# Patient Record
Sex: Male | Born: 1985
Health system: Southern US, Community
[De-identification: ages and names within clinical notes are randomized; demographics above are authoritative.]

## PROBLEM LIST (undated history)

## (undated) ENCOUNTER — Emergency Department (HOSPITAL_COMMUNITY): Source: Home / Self Care

## (undated) DIAGNOSIS — T7840XA Allergy, unspecified, initial encounter: Secondary | ICD-10-CM

## (undated) DIAGNOSIS — I10 Essential (primary) hypertension: Secondary | ICD-10-CM

## (undated) HISTORY — DX: Allergy, unspecified, initial encounter: T78.40XA

## (undated) HISTORY — PX: HIP PINNING: SHX1757

## (undated) HISTORY — PX: APPENDECTOMY: SHX54

---
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
## 2007-07-20 ENCOUNTER — Emergency Department (HOSPITAL_COMMUNITY): Admission: EM | Admit: 2007-07-20 | Discharge: 2007-07-21 | Payer: Self-pay | Admitting: Emergency Medicine

## 2008-06-07 ENCOUNTER — Ambulatory Visit (HOSPITAL_COMMUNITY): Admission: EM | Admit: 2008-06-07 | Discharge: 2008-06-08 | Payer: Self-pay | Admitting: Emergency Medicine

## 2008-06-08 ENCOUNTER — Encounter (INDEPENDENT_AMBULATORY_CARE_PROVIDER_SITE_OTHER): Payer: Self-pay | Admitting: General Surgery

## 2008-10-13 ENCOUNTER — Encounter: Admission: RE | Admit: 2008-10-13 | Discharge: 2008-10-13 | Payer: Self-pay | Admitting: General Surgery

## 2010-08-21 LAB — DIFFERENTIAL
Basophils Absolute: 0 10*3/uL (ref 0.0–0.1)
Eosinophils Relative: 1 % (ref 0–5)
Lymphocytes Relative: 12 % (ref 12–46)
Lymphs Abs: 1.3 10*3/uL (ref 0.7–4.0)
Monocytes Absolute: 0.8 10*3/uL (ref 0.1–1.0)
Neutro Abs: 8.8 10*3/uL — ABNORMAL HIGH (ref 1.7–7.7)

## 2010-08-21 LAB — CBC
HCT: 43.1 % (ref 39.0–52.0)
MCHC: 33.1 g/dL (ref 30.0–36.0)
MCV: 87.8 fL (ref 78.0–100.0)
Platelets: 254 10*3/uL (ref 150–400)
RDW: 13.7 % (ref 11.5–15.5)
WBC: 11 10*3/uL — ABNORMAL HIGH (ref 4.0–10.5)

## 2010-08-21 LAB — COMPREHENSIVE METABOLIC PANEL
AST: 25 U/L (ref 0–37)
Albumin: 4.3 g/dL (ref 3.5–5.2)
BUN: 13 mg/dL (ref 6–23)
Calcium: 9.1 mg/dL (ref 8.4–10.5)
Creatinine, Ser: 0.83 mg/dL (ref 0.4–1.5)
GFR calc Af Amer: >60 mL/min (ref >60)
Total Bilirubin: 0.7 mg/dL (ref 0.3–1.2)

## 2010-08-21 LAB — URINALYSIS, ROUTINE W REFLEX MICROSCOPIC
Bilirubin Urine: NEGATIVE
Glucose, UA: NEGATIVE mg/dL
Hgb urine dipstick: NEGATIVE
Ketones, ur: NEGATIVE mg/dL
Protein, ur: NEGATIVE mg/dL
Urobilinogen, UA: 1 mg/dL (ref 0.0–1.0)

## 2010-08-21 LAB — LIPASE, BLOOD: Lipase: 16 U/L (ref 11–59)

## 2010-09-03 ENCOUNTER — Emergency Department (HOSPITAL_COMMUNITY)
Admission: EM | Admit: 2010-09-03 | Discharge: 2010-09-03 | Disposition: A | Discharge status: Home / Self Care | Attending: Emergency Medicine | Admitting: Emergency Medicine

## 2010-09-03 DIAGNOSIS — K612 Anorectal abscess: Secondary | ICD-10-CM | POA: Insufficient documentation

## 2010-09-04 ENCOUNTER — Emergency Department (HOSPITAL_COMMUNITY)
Admission: EM | Admit: 2010-09-04 | Discharge: 2010-09-04 | Disposition: A | Discharge status: Home / Self Care | Attending: Emergency Medicine | Admitting: Emergency Medicine

## 2010-09-04 DIAGNOSIS — IMO0002 Reserved for concepts with insufficient information to code with codable children: Secondary | ICD-10-CM | POA: Insufficient documentation

## 2010-09-04 DIAGNOSIS — Y838 Other surgical procedures as the cause of abnormal reaction of the patient, or of later complication, without mention of misadventure at the time of the procedure: Secondary | ICD-10-CM | POA: Insufficient documentation

## 2010-09-04 DIAGNOSIS — F17200 Nicotine dependence, unspecified, uncomplicated: Secondary | ICD-10-CM | POA: Insufficient documentation

## 2010-09-04 LAB — BASIC METABOLIC PANEL
BUN: 12 mg/dL (ref 6–23)
CO2: 25 mEq/L (ref 19–32)
Glucose, Bld: 99 mg/dL (ref 70–99)
Potassium: 3.5 mEq/L (ref 3.5–5.1)
Sodium: 137 mEq/L (ref 135–145)

## 2010-09-04 LAB — DIFFERENTIAL
Basophils Absolute: 0 10*3/uL (ref 0.0–0.1)
Eosinophils Relative: 3 % (ref 0–5)
Lymphocytes Relative: 27 % (ref 12–46)
Lymphs Abs: 2.4 10*3/uL (ref 0.7–4.0)
Monocytes Absolute: 0.9 10*3/uL (ref 0.1–1.0)
Neutro Abs: 5.2 10*3/uL (ref 1.7–7.7)

## 2010-09-04 LAB — CBC
HCT: 38.9 % — ABNORMAL LOW (ref 39.0–52.0)
Hemoglobin: 12.7 g/dL — ABNORMAL LOW (ref 13.0–17.0)
MCV: 86.6 fL (ref 78.0–100.0)
WBC: 8.7 10*3/uL (ref 4.0–10.5)

## 2010-09-04 LAB — APTT: aPTT: 35 seconds (ref 24–37)

## 2010-09-18 NOTE — H&P (Signed)
NAMEHUBBARD, SELDON               ACCOUNT NO.:  [id number]   MEDICAL RECORD NO.:  [id number]          PATIENT TYPE:  INP   LOCATION:  0105                         FACILITY:  [facility]   PHYSICIAN:  Sharlet Salina T. Morrison, M.D.DATE OF BIRTH:  1986-01-19   DATE OF ADMISSION:  06/07/2008  DATE OF DISCHARGE:                              HISTORY & PHYSICAL   CHIEF COMPLAINT:  Abdominal pain.   HISTORY OF PRESENT ILLNESS:  Alexander Morrison is a 25 year old African  American male who states for about a week he has been having some  intermittent mild right lower quadrant pain.  He now, however, about 12  hours ago developed more constant severe right lower quadrant pain.  This is worse with motion.  No radiation.  He does not have any nausea,  vomiting, anorexia, fever or chills.  Bowel movements have been normal.  He has no history of any similar chronic GI complaints.   PAST MEDICAL HISTORY:  He has had bilateral hip pinning for a slipped  epiphysis at age 79.  Medically he has some seasonal allergies.   MEDICATIONS:  Flonase.   ALLERGIES:  IODINE.   SOCIAL HISTORY:  He is a Consulting civil engineer.  Smokes some cigarettes, drinks  occasional alcohol.   FAMILY HISTORY:  Positive for diabetes, gallstones in the grandparents.   REVIEW OF SYSTEMS:  GENERAL:  No fever, chills, malaise.  HEENT:  No  vision, hearing, swallowing problems.  RESPIRATORY:  No shortness of  breath, cough, wheezing.  CARDIAC:  Denies palpitations, chest pain.  ABDOMEN:  GI as above.  GU: No urinary burning, frequency.   PHYSICAL EXAMINATION:  Temperature is 98.2, pulse 88, respirations 16,  blood pressure 131/74.  GENERAL:  He is a somewhat obese African American male in no acute  distress.  SKIN:  Warm and dry.  No rash or infection.  HEENT:  No palpable mass or thyromegaly.  Sclerae nonicteric.  Oropharynx clear.  LYMPH NODES:  No cervical, supraclavicular or inguinal nodes palpable.  LUNGS:  Clear without wheezing or increased  work of breathing.  CARDIAC:  Regular rate and rhythm.  No murmurs.  No edema.  No JVD.  ABDOMEN:  Obese, decreased bowel sounds.  There is localized tenderness  in the right lower quadrant.  Some guarding.  No discernible masses or  organomegaly.  GU:  Normal male.  EXTREMITIES:  No joint swelling deformity.  NEUROLOGIC:  Alert, fully oriented.  Motor and sensory exams are grossly  normal.   LABORATORIES:  White count mildly elevated at 11,000.  Remainder of CBC,  electrolytes, LFTs, urinalysis unremarkable.   CT scan of the abdomen and pelvis was obtained from the emergency room.  This shows evidence of acute appendicitis with inflammatory change  around a dilated appendix.  Also noticed probable cholelithiasis.   ASSESSMENT/PLAN:  1. Acute appendicitis.  2. Cholelithiasis, probably asymptomatic.   The patient was receiving IV fluids, broad-spectrum antibiotics.  Will  be taken to the operating room for emergency laparoscopic appendectomy.      Alexander Morrison, M.D.  Electronically Signed     BTH/MEDQ  D:  06/08/2008  T:  06/08/2008  Job:  [id number]

## 2010-09-18 NOTE — Op Note (Signed)
NAMEVINCEN, BEJAR               ACCOUNT NO.:  [id number]   MEDICAL RECORD NO.:  [id number]          PATIENT TYPE:  INP   LOCATION:  0105                         FACILITY:  [facility]   PHYSICIAN:  Sharlet Salina T. Hoxworth, M.D.DATE OF BIRTH:  [birth year]   DATE OF PROCEDURE:  06/08/2008  DATE OF DISCHARGE:                               OPERATIVE REPORT   PREOPERATIVE DIAGNOSIS:  Acute appendicitis.   POSTOPERATIVE DIAGNOSIS:  Acute appendicitis.   PROCEDURE:  Laparoscopic appendectomy.   SURGEON:  Lorne Skeens. Hoxworth, M.D.   ANESTHESIA:  General.   BRIEF HISTORY:  Alexander Morrison is a 25 year old male who presents with  several days of mild right lower quadrant pain but now 12 hours of  worsening pain localized in the right lower quadrant without any  associated symptoms.  He has localized tenderness with guarding and CT  scan has shown evidence of acute appendicitis.  I have recommended  proceeding with laparoscopic and possible open appendectomy.  The nature  of the procedure, its indications, risks of bleeding, infection,  anesthetic risks were discussed and understood.  He is now brought to  the operating room for this procedure.   DESCRIPTION OF OPERATION:  The patient was brought to operating room,  placed in supine position on the operating table and general  endotracheal anesthesia was induced.  Foley catheter was placed.  The  abdomen was widely sterilely prepped and draped.  He received  preoperative IV antibiotics.  PAS were in place.  Correct patient and  procedure were verified.  Local anesthesia was used to infiltrate the  trocar sites.  Abdominal access was obtained in the right upper quadrant  with a 5 mm OptiVu trocar without difficulty and pneumoperitoneum  established.  Laparoscopy showed no evidence of trocar injury.  Under  direct vision an additional 5 mm trocar was placed in the midline just  above the umbilicus and a 12 mm trocar in the left lower quadrant.   The  appendix was exposed in the right lower quadrant and was acutely  inflamed with exudate but no gangrene or perforation.  It was somewhat  adherent to the terminal ileum and mesentery of the terminal ileum and  it was separated using careful blunt dissection.  The mesentery had some  chronic attachments to the mesentery of the terminal ileum which were  carefully divided with Harmonic scalpel completely freeing the mesentery  and appendix was further mobilized dividing lateral peritoneal  attachments.  The base the appendix was relatively uninflamed.  The  mesoappendix was then sequentially divided with Harmonic scalpel until  the appendix was completely freed down to its base and tip of the cecum.  The appendix was divided at the tip of the cecum with a single firing of  the Endo-GIA 45 mm blue load stapler.  The staple line was intact and  without bleeding.  The appendix was placed in the EndoCatch bag.  The  right lower quadrant was thoroughly irrigated and complete hemostasis  assured.  The appendix was then brought out  through the left lower quadrant trocar site along with the trocar.  CO2  was evacuated and 5 mm trocars removed.  The skin incisions were closed  with Dermabond.  Sponge, needle and instrument counts were correct.  The  patient was taken to recovery in good condition.      Lorne Skeens. Hoxworth, M.D.  Electronically Signed     BTH/MEDQ  D:  06/08/2008  T:  06/08/2008  Job:  [id number]

## 2012-09-02 ENCOUNTER — Ambulatory Visit: Payer: Self-pay | Admitting: Physician Assistant

## 2012-09-02 VITALS — BP 138/86 | HR 118 | Temp 98.3°F | Resp 18 | Ht 71.0 in | Wt 293.0 lb

## 2012-09-02 DIAGNOSIS — R062 Wheezing: Secondary | ICD-10-CM

## 2012-09-02 DIAGNOSIS — J309 Allergic rhinitis, unspecified: Secondary | ICD-10-CM

## 2012-09-02 DIAGNOSIS — R05 Cough: Secondary | ICD-10-CM

## 2012-09-02 DIAGNOSIS — R059 Cough, unspecified: Secondary | ICD-10-CM

## 2012-09-02 MED ORDER — ALBUTEROL SULFATE (2.5 MG/3ML) 0.083% IN NEBU
2.5000 mg | INHALATION_SOLUTION | Freq: Once | RESPIRATORY_TRACT | Status: AC
  Administered 2012-09-02: 2.5 mg via RESPIRATORY_TRACT

## 2012-09-02 MED ORDER — GUAIFENESIN ER 1200 MG PO TB12: 1.0000 | ORAL_TABLET | Freq: Two times a day (BID) | ORAL | Status: DC | PRN

## 2012-09-02 MED ORDER — IPRATROPIUM BROMIDE 0.02 % IN SOLN
0.5000 mg | Freq: Once | RESPIRATORY_TRACT | Status: AC
  Administered 2012-09-02: 0.5 mg via RESPIRATORY_TRACT

## 2012-09-02 MED ORDER — BENZONATATE 100 MG PO CAPS: 100.0000 mg | ORAL_CAPSULE | Freq: Three times a day (TID) | ORAL | Status: DC | PRN

## 2012-09-02 MED ORDER — ALBUTEROL SULFATE HFA 108 (90 BASE) MCG/ACT IN AERS: 2.0000 | INHALATION_SPRAY | RESPIRATORY_TRACT | Status: DC | PRN

## 2012-09-02 MED ORDER — FLUTICASONE PROPIONATE 50 MCG/ACT NA SUSP: 2.0000 | Freq: Every day | NASAL | Status: DC

## 2012-09-02 MED ORDER — IPRATROPIUM BROMIDE 0.03 % NA SOLN: 2.0000 | Freq: Two times a day (BID) | NASAL | Status: DC

## 2012-09-02 NOTE — Patient Instructions (Addendum)
Once your symptoms are under control, please stop the Atrovent nasal spray (you may resume it as needed), but continue the Flonase nasal spray (at least for the duration of your allergy season). Continue Allegra, Claritin or Zyrtec each day, as needed. Drink at least 64 ounces of water each day. Change your clothes and take a shower after you've been outside, to remove the pollen from your hair and skin, and to reduce continued exposure to allergens.  Did you know that you begin to benefit from quitting smoking within the first twenty minutes? It's TRUE.  At 20 minutes: -blood pressure decreases -pulse rate drops -body temperature of hands and feet increases  At 8 hours: -carbon monoxide level in blood drops to normal -oxygen level in blood increases to normal  At 24 hours: -the chance of heart attack decreases  At 48 hours: -nerve endings start regrowing -ability to smell and taste is enhanced  2 weeks-3 months: -circulation improves -walking becomes easier -lung function improves  1-9 months: -coughing, sinus congestion, fatigue and shortness of breath decreases  1 year: -excess risk of heart disease is decreased to HALF that of a smoker  5 years: Stroke risk is reduced to that of people who have never smoked  10 years: -risk of lung cancer drops to as little as half that of continuing smokers -risk of cancer of the mouth, throat, esophagus, bladder, kidney and pancreas decreases -risk of ulcer decreases  15 years -risk of heart disease is now similar to that of people who have never smoked -risk of death returns to nearly the level of people who have never smoked

## 2012-09-02 NOTE — Progress Notes (Signed)
  Subjective:    Patient ID: Alexander Morrison, male    DOB: March 27, 1986, 27 y.o.   MRN: [id number]  HPI This 27 y.o. male presents for evaluation of cough. "I've been smoking since I was 19.  Since I was 22, every time I get a cold, or allergies, I get bronchitis.  I quit smoking yesterday, and started coughing."   He has nasal congestion, post-nasal drainage, ear pressure.  Cough is non-productive.  Claritin and Mucinex are not controlling his symptoms.  Past medical history, surgical history, family history, social history and problem list reviewed.  Review of Systems As above.    Objective:   Physical Exam Blood pressure 138/86, pulse 118, temperature 98.3 F (36.8 C), temperature source Oral, resp. rate 18, height 5\' 11"  (1.803 m), weight 293 lb (132.904 kg), SpO2 95.00%. Body mass index is 40.88 kg/(m^2). Well-developed, well nourished BM who is awake, alert and oriented, in NAD. HEENT: /AT, PERRL, EOMI.  Sclera and conjunctiva are clear.  EAC are patent, TMs are normal in appearance. Nasal mucosa is congested,pink and moist. OP is clear. Neck: supple, non-tender, no lymphadenopathy, thyromegaly. Heart: RRR, no murmur Lungs: normal effort, high-pitched musical wheezes on exhalation. Extremities: no cyanosis, clubbing or edema. Skin: warm and dry without rash. Psychologic: good mood and appropriate affect, normal speech and behavior.  Albuterol + Atrovent neb treatment with reduction in wheezing and symptom improvement.      Assessment & Plan:  Cough - Plan: Guaifenesin (MUCINEX MAXIMUM STRENGTH) 1200 MG TB12, benzonatate (TESSALON) 100 MG capsule  Wheezing - Plan: albuterol (PROVENTIL) (2.5 MG/3ML) 0.083% nebulizer solution 2.5 mg, ipratropium (ATROVENT) nebulizer solution 0.5 mg, albuterol (PROVENTIL HFA;VENTOLIN HFA) 108 (90 BASE) MCG/ACT inhaler  Allergic rhinitis - Plan: ipratropium (ATROVENT) 0.03 % nasal spray, fluticasone (FLONASE) 50 MCG/ACT nasal spray  Supportive  care.  Anticipatory guidance.  RTC if symptoms worsen/persist.  Fernande Bras, PA-C Certified Physician Assistant [facility]   Fernande Bras, PA-C Physician Assistant-Certified [facility]

## 2012-12-01 ENCOUNTER — Encounter (HOSPITAL_COMMUNITY): Payer: Self-pay | Admitting: Emergency Medicine

## 2012-12-01 ENCOUNTER — Emergency Department (HOSPITAL_COMMUNITY)
Admission: EM | Admit: 2012-12-01 | Discharge: 2012-12-01 | Disposition: A | Discharge status: Home / Self Care | Payer: Self-pay | Attending: Emergency Medicine | Admitting: Emergency Medicine

## 2012-12-01 DIAGNOSIS — T7840XA Allergy, unspecified, initial encounter: Secondary | ICD-10-CM

## 2012-12-01 DIAGNOSIS — Z79899 Other long term (current) drug therapy: Secondary | ICD-10-CM | POA: Insufficient documentation

## 2012-12-01 DIAGNOSIS — J3489 Other specified disorders of nose and nasal sinuses: Secondary | ICD-10-CM | POA: Insufficient documentation

## 2012-12-01 DIAGNOSIS — J45901 Unspecified asthma with (acute) exacerbation: Secondary | ICD-10-CM | POA: Insufficient documentation

## 2012-12-01 DIAGNOSIS — Z87891 Personal history of nicotine dependence: Secondary | ICD-10-CM | POA: Insufficient documentation

## 2012-12-01 DIAGNOSIS — R062 Wheezing: Secondary | ICD-10-CM

## 2012-12-01 MED ORDER — IPRATROPIUM BROMIDE 0.02 % IN SOLN
0.5000 mg | Freq: Once | RESPIRATORY_TRACT | Status: AC
  Administered 2012-12-01: 0.5 mg via RESPIRATORY_TRACT
  Filled 2012-12-01: qty 2.5

## 2012-12-01 MED ORDER — ALBUTEROL SULFATE (5 MG/ML) 0.5% IN NEBU
2.5000 mg | INHALATION_SOLUTION | Freq: Once | RESPIRATORY_TRACT | Status: DC
  Filled 2012-12-01: qty 0.5

## 2012-12-01 MED ORDER — ALBUTEROL SULFATE (5 MG/ML) 0.5% IN NEBU
INHALATION_SOLUTION | RESPIRATORY_TRACT | Status: AC
  Administered 2012-12-01: 5 mg via RESPIRATORY_TRACT
  Filled 2012-12-01: qty 0.5

## 2012-12-01 MED ORDER — PREDNISONE 20 MG PO TABS
60.0000 mg | ORAL_TABLET | Freq: Once | ORAL | Status: AC
  Administered 2012-12-01: 60 mg via ORAL
  Filled 2012-12-01: qty 3

## 2012-12-01 MED ORDER — PREDNISONE 20 MG PO TABS: 40.0000 mg | ORAL_TABLET | Freq: Every day | ORAL | Status: DC

## 2012-12-01 MED ORDER — ALBUTEROL SULFATE HFA 108 (90 BASE) MCG/ACT IN AERS: 2.0000 | INHALATION_SPRAY | RESPIRATORY_TRACT | Status: DC | PRN

## 2012-12-01 MED ORDER — ALBUTEROL SULFATE HFA 108 (90 BASE) MCG/ACT IN AERS
2.0000 | INHALATION_SPRAY | Freq: Once | RESPIRATORY_TRACT | Status: AC
  Administered 2012-12-01: 2 via RESPIRATORY_TRACT
  Filled 2012-12-01: qty 6.7

## 2012-12-01 MED ORDER — ALBUTEROL SULFATE (5 MG/ML) 0.5% IN NEBU
5.0000 mg | INHALATION_SOLUTION | Freq: Once | RESPIRATORY_TRACT | Status: AC
  Administered 2012-12-01: 5 mg via RESPIRATORY_TRACT

## 2012-12-01 NOTE — ED Notes (Signed)
Pt states he has been feeling short of breath since yesterday  Pt states he was unable to sleep tonight  Pt states he used a friends inhaler yesterday at work and had some relief  Pt states he was prescribed an inhaler before but hasnt needed it in a long time  Pt states when he lays down he has a terrible cough

## 2012-12-01 NOTE — ED Provider Notes (Signed)
CSN: [id number]     Arrival date & time 12/01/12  0424 History     First MD Initiated Contact with Patient 12/01/12 [phone]     Chief Complaint  Patient presents with  . Shortness of Breath   (Consider location/radiation/quality/duration/timing/severity/associated sxs/prior Treatment) HPI  Alexander Morrison is a 27 y.o. male complaining of shortness of breath onset yesterday keeping him from sleeping tonight. Patient had asthma as a child, he was given a prescription for an inhaler several months ago but uses it approximately every other day does not use of multiple times a day. Patient also has rhinorrhea exacerbated by his seasonal allergies. Patient's dry cough. Denies fever or nausea vomiting, change in bowel or bladder habits.  Past Medical History  Diagnosis Date  . Allergy   . Asthma    Past Surgical History  Procedure Laterality Date  . Appendectomy     Family History  Problem Relation Age of Onset  . Hypertension Mother   . Hypertension Brother    History  Substance Use Topics  . Smoking status: Former Smoker    Quit date: 09/01/2012  . Smokeless tobacco: Not on file  . Alcohol Use: Yes    Review of Systems 10 systems reviewed and found to be negative, except as noted in the HPI  Allergies  Shellfish allergy and Iodine  Home Medications   Current Outpatient Rx  Name  Route  Sig  Dispense  Refill  . albuterol (PROVENTIL HFA;VENTOLIN HFA) 108 (90 BASE) MCG/ACT inhaler   Inhalation   Inhale 2 puffs into the lungs every 4 (four) hours as needed for wheezing (cough, shortness of breath or wheezing.).   1 Inhaler   1   . guaiFENesin (MUCINEX) 600 MG 12 hr tablet   Oral   Take 1,200 mg by mouth 2 (two) times daily.         Marland Kitchen loratadine-pseudoephedrine (CLARITIN-D 24-HOUR) 10-240 MG per 24 hr tablet   Oral   Take 1 tablet by mouth daily.         Marland Kitchen albuterol (PROVENTIL HFA;VENTOLIN HFA) 108 (90 BASE) MCG/ACT inhaler   Inhalation   Inhale 2 puffs into the  lungs every 4 (four) hours as needed for wheezing.   1 Inhaler   3   . predniSONE (DELTASONE) 20 MG tablet   Oral   Take 2 tablets (40 mg total) by mouth daily.   10 tablet   0    BP 131/89  Pulse 78  Temp(Src) 97.5 F (36.4 C) (Oral)  Resp 18  SpO2 96% Physical Exam  Nursing note and vitals reviewed. Constitutional: He is oriented to person, place, and time. He appears well-developed and well-nourished. No distress.  HENT:  Head: Normocephalic.  Right Ear: External ear normal.  Mouth/Throat: Oropharynx is clear and moist.  Posterior pharynx is mildly injected  Eyes: Conjunctivae and EOM are normal. Pupils are equal, round, and reactive to light.  Neck: Normal range of motion.  Cardiovascular: Normal rate.   Pulmonary/Chest: Effort normal. No stridor. No respiratory distress. He has wheezes. He has no rales. He exhibits no tenderness.  Mild expiratory wheezing.  Abdominal: Soft. Bowel sounds are normal. He exhibits no distension and no mass. There is no tenderness. There is no rebound and no guarding.  Musculoskeletal: Normal range of motion.  Neurological: He is alert and oriented to person, place, and time.  Psychiatric: He has a normal mood and affect.    ED Course   Procedures (including critical  care time)  Labs Reviewed - No data to display No results found. 1. Allergy, initial encounter   2. Wheezing     MDM   Filed Vitals:   12/01/12 0430 12/01/12 0437 12/01/12 0521 12/01/12 0548  BP: 147/87   131/89  Pulse: 77   78  Temp: 98.5 F (36.9 C)   97.5 F (36.4 C)  TempSrc: Oral   Oral  Resp: 16   18  SpO2: 96% 96% 96% 96%     Alexander Morrison is a 27 y.o. male for shortness of breath mild expiratory wheezing. Patient has significantly improved after single nebulizer treatment. Off the second treatment he declined. We'll start him on prednisone, thinks he has a reactive airway being set off by seasonal allergies with postnasal drip. Advised him to continue  to use his Claritin-D. Also nasal saline rinses.  Medications  predniSONE (DELTASONE) tablet 60 mg (60 mg Oral Given 12/01/12 0535)  ipratropium (ATROVENT) nebulizer solution 0.5 mg (0.5 mg Nebulization Given 12/01/12 0520)  albuterol (PROVENTIL) (5 MG/ML) 0.5% nebulizer solution 5 mg (5 mg Nebulization Given 12/01/12 0520)  albuterol (PROVENTIL HFA;VENTOLIN HFA) 108 (90 BASE) MCG/ACT inhaler 2 puff (2 puffs Inhalation Given 12/01/12 0618)    Pt is hemodynamically stable, appropriate for, and amenable to discharge at this time. Pt verbalized understanding and agrees with care plan. All questions answered. Outpatient follow-up and specific return precautions discussed.    Discharge Medication List as of 12/01/2012  6:06 AM    START taking these medications   Details  !! albuterol (PROVENTIL HFA;VENTOLIN HFA) 108 (90 BASE) MCG/ACT inhaler Inhale 2 puffs into the lungs every 4 (four) hours as needed for wheezing., Starting 12/01/2012, Until Discontinued, Print    predniSONE (DELTASONE) 20 MG tablet Take 2 tablets (40 mg total) by mouth daily., Starting 12/01/2012, Until Discontinued, Print     !! - Potential duplicate medications found. Please discuss with provider.      Note: Portions of this report may have been transcribed using voice recognition software. Every effort was made to ensure accuracy; however, inadvertent computerized transcription errors may be present    Wynetta Emery, PA-C 12/01/12 0703

## 2012-12-01 NOTE — ED Provider Notes (Signed)
Medical screening examination/treatment/procedure(s) were performed by non-physician practitioner and as supervising physician I was immediately available for consultation/collaboration.   Hanley Seamen, MD 12/01/12 [phone]

## 2013-09-20 ENCOUNTER — Encounter (HOSPITAL_COMMUNITY): Payer: Self-pay | Admitting: Emergency Medicine

## 2013-09-20 ENCOUNTER — Emergency Department (HOSPITAL_COMMUNITY)
Admission: EM | Admit: 2013-09-20 | Discharge: 2013-09-20 | Disposition: A | Discharge status: Home / Self Care | Attending: Emergency Medicine | Admitting: Emergency Medicine

## 2013-09-20 DIAGNOSIS — F17200 Nicotine dependence, unspecified, uncomplicated: Secondary | ICD-10-CM | POA: Insufficient documentation

## 2013-09-20 DIAGNOSIS — Z79899 Other long term (current) drug therapy: Secondary | ICD-10-CM | POA: Insufficient documentation

## 2013-09-20 DIAGNOSIS — E663 Overweight: Secondary | ICD-10-CM | POA: Insufficient documentation

## 2013-09-20 DIAGNOSIS — J45901 Unspecified asthma with (acute) exacerbation: Secondary | ICD-10-CM | POA: Insufficient documentation

## 2013-09-20 DIAGNOSIS — J9801 Acute bronchospasm: Secondary | ICD-10-CM

## 2013-09-20 MED ORDER — ALBUTEROL SULFATE HFA 108 (90 BASE) MCG/ACT IN AERS: 2.0000 | INHALATION_SPRAY | RESPIRATORY_TRACT | Status: DC | PRN

## 2013-09-20 MED ORDER — DEXAMETHASONE SODIUM PHOSPHATE 10 MG/ML IJ SOLN
10.0000 mg | Freq: Once | INTRAMUSCULAR | Status: AC
  Administered 2013-09-20: 10 mg via INTRAMUSCULAR
  Filled 2013-09-20: qty 1

## 2013-09-20 MED ORDER — ALBUTEROL SULFATE HFA 108 (90 BASE) MCG/ACT IN AERS
2.0000 | INHALATION_SPRAY | RESPIRATORY_TRACT | Status: DC | PRN
  Administered 2013-09-20: 2 via RESPIRATORY_TRACT
  Filled 2013-09-20: qty 6.7

## 2013-09-20 NOTE — Discharge Instructions (Signed)
Bronchospasm, Adult °A bronchospasm is when the tubes that carry air in and out of your lungs (airwarys) spasm or tighten. During a bronchospasm it is hard to breathe. This is because the airways get smaller. A bronchospasm can be triggered by: °· Allergies. These may be to animals, pollen, food, or mold. °· Infection. This is a common cause of bronchospasm. °· Exercise. °· Irritants. These include pollution, cigarette smoke, strong odors, aerosol sprays, and paint fumes. °· Weather changes. °· Stress. °· Being emotional. °HOME CARE  °· Always have a plan for getting help. Know when to call your doctor and local emergency services (911 in the [place] Know where you can get emergency care. °· Only take medicines as told by your doctor. °· If you were prescribed an inhaler or nebulizer machine, ask your doctor how to use it correctly. Always use a spacer with your inhaler if you were given one. °· Stay calm during an attack. Try to relax and breathe more slowly. °· Control your home environment: °· Change your heating and air conditioning filter at least once a month. °· Limit your use of fireplaces and wood stoves. °· Do not  smoke. Do not  allow smoking in your home. °· Avoid perfumes and fragrances. °· Get rid of pests (such as roaches and mice) and their droppings. °· Throw away plants if you see mold on them. °· Keep your house clean and dust free. °· Replace carpet with wood, tile, or vinyl flooring. Carpet can trap dander and dust. °· Use allergy-proof pillows, mattress covers, and box spring covers. °· Wash bed sheets and blankets every week in hot water. Dry them in a dryer. °· Use blankets that are made of polyester or cotton. °· Wash hands frequently. °GET HELP IF: °· You have muscle aches. °· You have chest pain. °· The thick spit you spit or cough up (sputum) changes from clear or white to yellow, green, gray, or bloody. °· The thick spit you spit or cough up gets thicker. °· There are problems that may be  related to the medicine you are given such as: °· A rash. °· Itching. °· Swelling. °· Trouble breathing. °GET HELP RIGHT AWAY IF: °· You feel you cannot breathe or catch your breath. °· You cannot stop coughing. °· Your treatment is not helping you breathe better. °MAKE SURE YOU:  °· Understand these instructions. °· Will watch your condition. °· Will get help right away if you are not doing well or get worse. °Document Released: 02/17/2009 Document Revised: 12/23/2012 Document Reviewed: 10/13/2012 °Exit[employer]® Patient Information ©2014 [employer]. ° °

## 2013-09-20 NOTE — ED Notes (Signed)
Pt states he is having shortness of breath that started after cleaning the house on Friday  Pt states it has progressively gotten worse since then  Pt states his abd muscles are sore from breathing so hard  Pt is in no acute distress in triage

## 2013-09-20 NOTE — ED Provider Notes (Signed)
CSN: [id number]     Arrival date & time 09/20/13  0302 History   First MD Initiated Contact with Patient 09/20/13 0407     Chief Complaint  Patient presents with  . Shortness of Breath     (Consider location/radiation/quality/duration/timing/severity/associated sxs/prior Treatment) HPI  This is a 28 year old male with a history of asthma who presents with shortness of breath. Patient reports several day history of worsening shortness of breath. He states that he cleaned his house on Friday and was exposed to "a lot of dust." He states that he normally wheezes this time of year her with allergy season. He denies any chest pain, cough, or fever. He took a bronchodilator over-the-counter prior to coming in which helped. He denies any leg swelling. This episode is consistent with prior similar episodes.  Past Medical History  Diagnosis Date  . Allergy   . Asthma    Past Surgical History  Procedure Laterality Date  . Appendectomy    . Hip pinning     Family History  Problem Relation Age of Onset  . Hypertension Mother   . Hypertension Brother   . Diabetes Other   . CAD Other    History  Substance Use Topics  . Smoking status: Current Every Day Smoker    Types: Cigarettes    Last Attempt to Quit: 09/01/2012  . Smokeless tobacco: Not on file  . Alcohol Use: Yes     Comment: occ    Review of Systems  Constitutional: Negative.  Negative for fever.  Respiratory: Positive for shortness of breath and wheezing. Negative for chest tightness.   Cardiovascular: Negative.  Negative for chest pain and leg swelling.  Gastrointestinal: Negative.  Negative for abdominal pain.  Genitourinary: Negative.  Negative for dysuria.  All other systems reviewed and are negative.     Allergies  Shellfish allergy and Iodine  Home Medications   Prior to Admission medications   Medication Sig Start Date End Date Taking? Authorizing Provider  EPHEDRINE-GUAIFENESIN PO Take 1 tablet by mouth 4  (four) times daily as needed. For cough & drainage   Yes Historical Provider, MD  albuterol (PROVENTIL HFA;VENTOLIN HFA) 108 (90 BASE) MCG/ACT inhaler Inhale 2 puffs into the lungs every 4 (four) hours as needed for wheezing (cough, shortness of breath or wheezing.). 09/02/12   Chelle S Jeffery, PA-C   BP 137/81  Pulse 77  Temp(Src) 98.4 F (36.9 C) (Oral)  Resp 19  Ht 5\' 11"  (1.803 m)  Wt 290 lb (131.543 kg)  BMI 40.46 kg/m2  SpO2 98% Physical Exam  Nursing note and vitals reviewed. Constitutional: He is oriented to person, place, and time. He appears well-developed and well-nourished. No distress.  overweight  HENT:  Head: Normocephalic and atraumatic.  Eyes: Pupils are equal, round, and reactive to light.  Neck: Neck supple.  Cardiovascular: Normal rate, regular rhythm and normal heart sounds.   No murmur heard. Pulmonary/Chest: Effort normal and breath sounds normal. No respiratory distress. He has no wheezes.  Musculoskeletal: He exhibits no edema.  Lymphadenopathy:    He has no cervical adenopathy.  Neurological: He is alert and oriented to person, place, and time.  Skin: Skin is warm and dry.  Psychiatric: He has a normal mood and affect.    ED Course  Procedures (including critical care time) Labs Review Labs Reviewed - No data to display  Imaging Review No results found.   EKG Interpretation None      MDM   Final diagnoses:  Bronchospasm   Patient presents with increasing shortness of breath following exposure to allergens. Currently he is not having any active wheezing but states that he felt like he was wheezing earlier. Wheezing improved with over-the-counter ephedrine. Given history of asthma and allergies will give patient IM Decadron and an inhaler. He is PERC negative and given history of similar episodes, feel this is likely bronchospasm. He is in no acute distress and satting 98% on room air. Patient will be discharged home.  After history, exam,  and medical workup I feel the patient has been appropriately medically screened and is safe for discharge home. Pertinent diagnoses were discussed with the patient. Patient was given return precautions.     Shon Batonourtney F Horton, MD 09/20/13 [phone]

## 2016-10-21 ENCOUNTER — Ambulatory Visit (INDEPENDENT_AMBULATORY_CARE_PROVIDER_SITE_OTHER): Payer: 59 | Admitting: Physician Assistant

## 2016-10-21 ENCOUNTER — Encounter: Payer: Self-pay | Admitting: Physician Assistant

## 2016-10-21 VITALS — BP 140/80 | HR 80 | Temp 98.0°F | Ht 70.0 in | Wt 278.2 lb

## 2016-10-21 DIAGNOSIS — Z23 Encounter for immunization: Secondary | ICD-10-CM

## 2016-10-21 DIAGNOSIS — Z136 Encounter for screening for cardiovascular disorders: Secondary | ICD-10-CM

## 2016-10-21 DIAGNOSIS — E669 Obesity, unspecified: Secondary | ICD-10-CM | POA: Diagnosis not present

## 2016-10-21 DIAGNOSIS — Z13220 Encounter for screening for lipoid disorders: Secondary | ICD-10-CM | POA: Diagnosis not present

## 2016-10-21 DIAGNOSIS — J4540 Moderate persistent asthma, uncomplicated: Secondary | ICD-10-CM | POA: Diagnosis not present

## 2016-10-21 DIAGNOSIS — R0683 Snoring: Secondary | ICD-10-CM | POA: Diagnosis not present

## 2016-10-21 DIAGNOSIS — Z0000 Encounter for general adult medical examination without abnormal findings: Secondary | ICD-10-CM | POA: Diagnosis not present

## 2016-10-21 DIAGNOSIS — Z114 Encounter for screening for human immunodeficiency virus [HIV]: Secondary | ICD-10-CM | POA: Diagnosis not present

## 2016-10-21 DIAGNOSIS — F339 Major depressive disorder, recurrent, unspecified: Secondary | ICD-10-CM | POA: Insufficient documentation

## 2016-10-21 DIAGNOSIS — T7840XA Allergy, unspecified, initial encounter: Secondary | ICD-10-CM | POA: Insufficient documentation

## 2016-10-21 LAB — COMPREHENSIVE METABOLIC PANEL
ALT: 42 U/L (ref 0–53)
AST: 27 U/L (ref 0–37)
Albumin: 4.3 g/dL (ref 3.5–5.2)
Alkaline Phosphatase: 69 U/L (ref 39–117)
BUN: 14 mg/dL (ref 6–23)
CALCIUM: 9.8 mg/dL (ref 8.4–10.5)
CHLORIDE: 106 meq/L (ref 96–112)
CO2: 27 meq/L (ref 19–32)
CREATININE: 0.93 mg/dL (ref 0.40–1.50)
GFR: 123.53 mL/min (ref >60.00)
Glucose, Bld: 92 mg/dL (ref 70–99)
POTASSIUM: 4.1 meq/L (ref 3.5–5.1)
SODIUM: 139 meq/L (ref 135–145)
Total Bilirubin: 0.2 mg/dL (ref 0.2–1.2)
Total Protein: 7.1 g/dL (ref 6.0–8.3)

## 2016-10-21 LAB — CBC WITH DIFFERENTIAL/PLATELET
BASOS PCT: 1 % (ref 0.0–3.0)
Basophils Absolute: 0 10*3/uL (ref 0.0–0.1)
EOS PCT: 8.4 % — AB (ref 0.0–5.0)
Eosinophils Absolute: 0.4 10*3/uL (ref 0.0–0.7)
HCT: 43.4 % (ref 39.0–52.0)
Hemoglobin: 14.2 g/dL (ref 13.0–17.0)
Lymphocytes Relative: 43.4 % (ref 12.0–46.0)
Lymphs Abs: 1.9 10*3/uL (ref 0.7–4.0)
MCHC: 32.8 g/dL (ref 30.0–36.0)
MCV: 89.3 fl (ref 78.0–100.0)
MONO ABS: 0.5 10*3/uL (ref 0.1–1.0)
Monocytes Relative: 10 % (ref 3.0–12.0)
NEUTROS ABS: 1.7 10*3/uL (ref 1.4–7.7)
Neutrophils Relative %: 37.2 % — ABNORMAL LOW (ref 43.0–77.0)
Platelets: 262 10*3/uL (ref 150.0–400.0)
RBC: 4.86 Mil/uL (ref 4.22–5.81)
RDW: 13.6 % (ref 11.5–15.5)
WBC: 4.5 10*3/uL (ref 4.0–10.5)

## 2016-10-21 LAB — LIPID PANEL
Cholesterol: 170 mg/dL (ref 0–200)
HDL: 65.3 mg/dL (ref >39.00)
LDL Cholesterol: 83 mg/dL (ref 0–99)
NonHDL: 104.44
Total CHOL/HDL Ratio: 3
Triglycerides: 107 mg/dL (ref 0.0–149.0)
VLDL: 21.4 mg/dL (ref 0.0–40.0)

## 2016-10-21 LAB — TSH: TSH: 2.69 u[IU]/mL (ref 0.35–4.50)

## 2016-10-21 MED ORDER — BECLOMETHASONE DIPROPIONATE 80 MCG/ACT IN AERS: 1.0000 | INHALATION_SPRAY | Freq: Two times a day (BID) | RESPIRATORY_TRACT | 2 refills | Status: DC

## 2016-10-21 NOTE — Assessment & Plan Note (Signed)
PHQ-9 is 6 today. He denies any suicidal thoughts. He is interested in speaking with a therapist. I have given him a brochure of multiple therapists in [place]. I asked patient to follow-up with us if he is unable to get in with a therapist has other concerns. Patient should go to the ER if he has any concerns for suicidal ideation.

## 2016-10-21 NOTE — Assessment & Plan Note (Signed)
BMI is 39. We discussed weight loss. He has been going to the gym for about 3 weeks now. I commended patient on this habit. He is going to follow-up with us to discuss nutrition. We discussed continued healthier eating, as well as continuing the gym. I advised patient to follow-up with us when he is ready for more formal nutrition education.

## 2016-10-21 NOTE — Assessment & Plan Note (Signed)
Currently uncontrolled. Using albuterol daily. Has responded well to inhaled corticosteroid in the past. We'll start Qvar today. Patient would like a referral to allergy and asthma, I have put this in for patient today. I discussed with patient that if this isn't effective treatment for him in the meantime to please let us know.

## 2016-10-21 NOTE — Progress Notes (Addendum)
Alexander Morrison is a 31 y.o. male here to Establish Care and Annual Physical.  I acted as a Neurosurgeonscribe for Energy East CorporationSamantha Destony Prevost, PA-C Corky Mullonna Orphanos, LPN  History of Present Illness:   Chief Complaint  Patient presents with  . Establish Care    [employer]  . Annual Exam    Acute Concerns: None  Chronic Issues: Asthma -- has been on albuterol for several years. Uses albuterol daily pretty much now. Was on steroid-inhaler but was several years ago. Has never seen an asthma doctor or allergist, but is requesting to see one today. Has had to go to the ED for asthma exacerbation in the past. Has outdoor and cat allergies. Depression -- has had panic attacks in the past (several years ago), but currently does not have any issues with this. Struggles more with depression. Has never had treatment or seen a therapist. He is interested in seeing a therapist.   Health Maintenance: Immunizations -- tetanus today Diet -- "I eat terribly", eats out often, is on the go a lot, fiancee is a nutrition student so he gets occasional healthy foods Caffeine intake -- 2-3 cups of coffee daily, energy drinks maybe 4 x a year (very rare) Sleep habits -- sleeps well, does snore and has concerns for sleep apnea Exercise -- cardio or strength activity every day Weight -- Weight: 278 lb 4 oz (126.2 kg)  -- was around 250 lb 3-4 years ago, job is now more sedentary and after he quit smoking he gained about 10 lb Mood -- does occasionally deal with anger, but mostly has issues with depression; no guns in home  Depression screen [facility] 2[facility] 10/21/2016  Decreased Interest 1  Down, Depressed, Hopeless 1  PHQ - 2 Score 2  Altered sleeping 0  Tired, decreased energy 0  Change in appetite 2  Feeling bad or failure about yourself  2  Trouble concentrating 0  Moving slowly or fidgety/restless 0  Suicidal thoughts 0  PHQ-9 Score 6  Difficult doing work/chores Somewhat difficult   Other providers/specialists: Going to establish  with a dentist soon  PMHx, SurgHx, SocialHx, Medications, and Allergies were reviewed in the Visit Navigator and updated as appropriate.  Current Medications:   Current Outpatient Prescriptions:  .  calcium carbonate (TUMS - DOSED IN MG ELEMENTAL CALCIUM) 500 MG chewable tablet, Chew 1 tablet by mouth as needed for indigestion or heartburn., Disp: , Rfl:  .  fluticasone (FLONASE) 50 MCG/ACT nasal spray, Place 1 spray into both nostrils daily., Disp: , Rfl:  .  loratadine (CLARITIN) 10 MG tablet, Take 10 mg by mouth daily., Disp: , Rfl:  .  PROAIR RESPICLICK 108 (90 Base) MCG/ACT AEPB, , Disp: , Rfl:  .  RaNITidine HCl (ZANTAC 75 PO), Take 1 tablet by mouth as needed., Disp: , Rfl:  .  beclomethasone (QVAR) 80 MCG/ACT inhaler, Inhale 1 puff into the lungs 2 (two) times daily., Disp: 1 Inhaler, Rfl: 2   Review of Systems:   Review of Systems  Constitutional: Negative for chills, fever, malaise/fatigue and weight loss.  HENT: Negative for hearing loss, sinus pain and sore throat.   Eyes: Negative for blurred vision.  Respiratory: Positive for cough. Negative for shortness of breath.   Cardiovascular: Negative for chest pain, palpitations and leg swelling.  Gastrointestinal: Positive for heartburn. Negative for abdominal pain, constipation, diarrhea, nausea and vomiting.  Genitourinary: Negative for dysuria, frequency and urgency.  Musculoskeletal: Negative for back pain, myalgias and neck pain.  Skin: Negative for  itching and rash.       Eczema in the winter  Neurological: Negative for dizziness, tingling, seizures, loss of consciousness and headaches.  Endo/Heme/Allergies: Positive for environmental allergies. Negative for polydipsia.  Psychiatric/Behavioral: Positive for substance abuse. Negative for depression and suicidal ideas. The patient is not nervous/anxious.        Marijuana 3 times a week, has been using past 10 years off and on.    Vitals:   Vitals:   10/21/16 0840  BP:  140/80  Pulse: 80  Temp: 98 F (36.7 C)  TempSrc: Oral  SpO2: 96%  Weight: 278 lb 4 oz (126.2 kg)  Height: 5\' 10"  (1.778 m)     Body mass index is 39.92 kg/m.  Physical Exam:   Physical Exam  Constitutional: He appears well-developed. He is cooperative.  Non-toxic appearance. He does not have a sickly appearance. He does not appear ill. No distress.  HENT:  Head: Normocephalic and atraumatic.  Right Ear: Tympanic membrane normal. Tympanic membrane is not erythematous, not retracted and not bulging.  Left Ear: Tympanic membrane normal. Tympanic membrane is not erythematous, not retracted and not bulging.  Nose: Nose normal.  Mouth/Throat: Uvula is midline and oropharynx is clear and moist.  Eyes: Conjunctivae, EOM and lids are normal. Pupils are equal, round, and reactive to light.    Neck: Trachea normal.  Cardiovascular: Normal rate, regular rhythm, S1 normal, S2 normal, normal heart sounds and normal pulses.   Pulmonary/Chest: Effort normal and breath sounds normal.  Abdominal: Normal appearance and bowel sounds are normal. There is no tenderness.  Genitourinary:  Genitourinary Comments: Deferred  Lymphadenopathy:    He has no cervical adenopathy.  Neurological: He is alert.  Skin: Skin is warm, dry and intact.  Psychiatric: He has a normal mood and affect. His speech is normal and behavior is normal. Thought content normal.  Nursing note and vitals reviewed.     Assessment and Plan:    Problem List Items Addressed This Visit      Respiratory   Moderate persistent asthma without complication    Currently uncontrolled. Using albuterol daily. Has responded well to inhaled corticosteroid in the past. We'll start Qvar today. Patient would like a referral to allergy and asthma, I have put this in for patient today. I discussed with patient that if this isn't effective treatment for him in the meantime to please let us know.      Relevant Medications   PROAIR RESPICLICK  108 (90 Base) MCG/ACT AEPB   beclomethasone (QVAR) 80 MCG/ACT inhaler   Other Relevant Orders   Ambulatory referral to Allergy     Other   Depression, recurrent (HCC)    PHQ-9 is 6 today. He denies any suicidal thoughts. He is interested in speaking with a therapist. I have given him a brochure of multiple therapists in [place]. I asked patient to follow-up with [place] if he is unable to get in with a therapist has other concerns. Patient should go to the ER if he has any concerns for suicidal ideation.      Relevant Orders   TSH   Snoring Patient is obese, borderline morbidly obese. He does snore and reports that his partner has commented that he stops breathing occasionally during sleep. He is agreeable to home sleep test.  Obesity    BMI is 39. We discussed weight loss. He has been going to the gym for about 3 weeks now. I commended patient on this habit. He is going  to follow-up with [place] to discuss nutrition. We discussed continued healthier eating, as well as continuing the gym. I advised patient to follow-up with [place] when he is ready for more formal nutrition education.      Relevant Medications   calcium carbonate (TUMS - DOSED IN MG ELEMENTAL CALCIUM) 500 MG chewable tablet   Other Relevant Orders   TSH    Other Visit Diagnoses    Routine general medical examination at a health care facility    -  Primary Today patient counseled on age appropriate routine health concerns for screening and prevention, each reviewed and up to date or declined. Immunizations reviewed and up to date or declined. Labs ordered and reviewed. Risk factors for depression reviewed and negative. Hearing function and visual acuity are intact. ADLs screened and addressed as needed. Functional ability and level of safety reviewed and appropriate. Education, counseling and referrals performed based on assessed risks today. Patient provided with a copy of personalized plan for preventive services.   Relevant Orders    CBC with Differential/Platelet   Comprehensive metabolic panel   Need for prophylactic vaccination with combined diphtheria-tetanus-pertussis (DTP) vaccine       Relevant Orders   Tdap vaccine greater than or equal to 7yo IM (Completed)   Encounter for screening for HIV       Relevant Orders   HIV antibody   Encounter for lipid screening for cardiovascular disease       Relevant Orders   Lipid panel      . Reviewed expectations re: course of current medical issues. . Discussed self-management of symptoms. . Outlined signs and symptoms indicating need for more acute intervention. . Patient verbalized understanding and all questions were answered. . See orders for this visit as documented in the electronic medical record. . Patient received an After-Visit Summary.  CMA or LPN served as scribe during this visit. History, Physical, and Plan performed by medical provider. Documentation and orders reviewed and attested to.  Jarold Motto, PA-C

## 2016-10-21 NOTE — Patient Instructions (Addendum)
It was great meeting you!  We will call you with your lab results.  Please contact a therapist.  You will be contacted with your referral to Asthma and Allergy and the sleep study.  Use the QVAR inhaler twice daily -- once in the morning and once in the afternoon, you may use albuterol in between, if needed.   Health Maintenance, Male A healthy lifestyle and preventive care is important for your health and wellness. Ask your health care provider about what schedule of regular examinations is right for you. What should I know about weight and diet? Eat a Healthy Diet  Eat plenty of vegetables, fruits, whole grains, low-fat dairy products, and lean protein.  Do not eat a lot of foods high in solid fats, added sugars, or salt.  Maintain a Healthy Weight Regular exercise can help you achieve or maintain a healthy weight. You should:  Do at least 150 minutes of exercise each week. The exercise should increase your heart rate and make you sweat (moderate-intensity exercise).  Do strength-training exercises at least twice a week.  Watch Your Levels of Cholesterol and Blood Lipids  Have your blood tested for lipids and cholesterol every 5 years starting at 29 years of age. If you are at high risk for heart disease, you should start having your blood tested when you are 29 years old. You may need to have your cholesterol levels checked more often if: ? Your lipid or cholesterol levels are high. ? You are older than 29 years of age. ? You are at high risk for heart disease.  What should I know about cancer screening? Many types of cancers can be detected early and may often be prevented. Lung Cancer  You should be screened every year for lung cancer if: ? You are a current smoker who has smoked for at least 30 years. ? You are a former smoker who has quit within the past 15 years.  Talk to your health care provider about your screening options, when you should start screening, and how  often you should be screened.  Colorectal Cancer  Routine colorectal cancer screening usually begins at 29 years of age and should be repeated every 5-10 years until you are 29 years old. You may need to be screened more often if early forms of precancerous polyps or small growths are found. Your health care provider may recommend screening at an earlier age if you have risk factors for colon cancer.  Your health care provider may recommend using home test kits to check for hidden blood in the stool.  A small camera at the end of a tube can be used to examine your colon (sigmoidoscopy or colonoscopy). This checks for the earliest forms of colorectal cancer.  Prostate and Testicular Cancer  Depending on your age and overall health, your health care provider may do certain tests to screen for prostate and testicular cancer.  Talk to your health care provider about any symptoms or concerns you have about testicular or prostate cancer.  Skin Cancer  Check your skin from head to toe regularly.  Tell your health care provider about any new moles or changes in moles, especially if: ? There is a change in a mole's size, shape, or color. ? You have a mole that is larger than a pencil eraser.  Always use sunscreen. Apply sunscreen liberally and repeat throughout the day.  Protect yourself by wearing long sleeves, pants, a wide-brimmed hat, and sunglasses when outside.  What  should I know about heart disease, diabetes, and high blood pressure?  If you are 7218-29 years of age, have your blood pressure checked every 3-5 years. If you are 29 years of age or older, have your blood pressure checked every year. You should have your blood pressure measured twice-once when you are at a hospital or clinic, and once when you are not at a hospital or clinic. Record the average of the two measurements. To check your blood pressure when you are not at a hospital or clinic, you can use: ? An automated blood  pressure machine at a pharmacy. ? A home blood pressure monitor.  Talk to your health care provider about your target blood pressure.  If you are between 7245-29 years old, ask your health care provider if you should take aspirin to prevent heart disease.  Have regular diabetes screenings by checking your fasting blood sugar level. ? If you are at a normal weight and have a low risk for diabetes, have this test once every three years after the age of 29. ? If you are overweight and have a high risk for diabetes, consider being tested at a younger age or more often.  A one-time screening for abdominal aortic aneurysm (AAA) by ultrasound is recommended for men aged 65-75 years who are current or former smokers. What should I know about preventing infection? Hepatitis B If you have a higher risk for hepatitis B, you should be screened for this virus. Talk with your health care provider to find out if you are at risk for hepatitis B infection. Hepatitis C Blood testing is recommended for:  Everyone born from [id number] through 1965.  Anyone with known risk factors for hepatitis C.  Sexually Transmitted Diseases (STDs)  You should be screened each year for STDs including gonorrhea and chlamydia if: ? You are sexually active and are younger than 29 years of age. ? You are older than 29 years of age and your health care provider tells you that you are at risk for this type of infection. ? Your sexual activity has changed since you were last screened and you are at an increased risk for chlamydia or gonorrhea. Ask your health care provider if you are at risk.  Talk with your health care provider about whether you are at high risk of being infected with HIV. Your health care provider may recommend a prescription medicine to help prevent HIV infection.  What else can I do?  Schedule regular health, dental, and eye exams.  Stay current with your vaccines (immunizations).  Do not use any tobacco  products, such as cigarettes, chewing tobacco, and e-cigarettes. If you need help quitting, ask your health care provider.  Limit alcohol intake to no more than 2 drinks per day. One drink equals 12 ounces of beer, 5 ounces of wine, or 1 ounces of hard liquor.  Do not use street drugs.  Do not share needles.  Ask your health care provider for help if you need support or information about quitting drugs.  Tell your health care provider if you often feel depressed.  Tell your health care provider if you have ever been abused or do not feel safe at home. This information is not intended to replace advice given to you by your health care provider. Make sure you discuss any questions you have with your health care provider. Document Released: 10/19/2007 Document Revised: 12/20/2015 Document Reviewed: 01/24/2015 Elsevier Interactive Patient Education  [employer]2018 [employer]

## 2016-10-22 LAB — HIV ANTIBODY (ROUTINE TESTING W REFLEX): HIV 1&2 Ab, 4th Generation: NONREACTIVE

## 2016-10-23 DIAGNOSIS — R0683 Snoring: Secondary | ICD-10-CM | POA: Insufficient documentation

## 2016-10-23 NOTE — Assessment & Plan Note (Signed)
Patient is obese, borderline morbidly obese. He does snore and reports that his partner has commented that he stops breathing occasionally during sleep. He is agreeable to home sleep test.

## 2016-10-23 NOTE — Addendum Note (Signed)
Addended by: Haynes BastWORLEY, Tynleigh Birt J on: 10/23/2016 08:18 AM   Modules accepted: Orders

## 2016-10-25 ENCOUNTER — Encounter: Payer: Self-pay | Admitting: Physician Assistant

## 2016-11-19 ENCOUNTER — Telehealth: Payer: Self-pay | Admitting: Physician Assistant

## 2016-11-19 NOTE — Telephone Encounter (Signed)
FYI, please see message. 

## 2016-11-19 NOTE — Telephone Encounter (Signed)
[facility] has called the patient and emergency contact a total of 3x and has been unsuccessful with getting in touch with the patient to schedule for sleep study. Please be aware.

## 2016-12-19 ENCOUNTER — Other Ambulatory Visit: Payer: Self-pay | Admitting: Physician Assistant

## 2016-12-19 MED ORDER — FLUTICASONE PROPIONATE HFA 44 MCG/ACT IN AERO: 2.0000 | INHALATION_SPRAY | Freq: Two times a day (BID) | RESPIRATORY_TRACT | 12 refills | Status: DC

## 2016-12-25 ENCOUNTER — Other Ambulatory Visit: Payer: Self-pay | Admitting: Physician Assistant

## 2017-11-13 ENCOUNTER — Encounter: Payer: Self-pay | Admitting: *Deleted

## 2018-01-13 ENCOUNTER — Other Ambulatory Visit (HOSPITAL_COMMUNITY)
Admission: RE | Admit: 2018-01-13 | Discharge: 2018-01-13 | Disposition: A | Discharge status: Home / Self Care | Payer: BLUE CROSS/BLUE SHIELD | Source: Ambulatory Visit | Attending: Physician Assistant | Admitting: Physician Assistant

## 2018-01-13 ENCOUNTER — Encounter: Payer: Self-pay | Admitting: Physician Assistant

## 2018-01-13 ENCOUNTER — Ambulatory Visit (INDEPENDENT_AMBULATORY_CARE_PROVIDER_SITE_OTHER): Payer: BLUE CROSS/BLUE SHIELD | Admitting: Physician Assistant

## 2018-01-13 VITALS — BP 128/88 | HR 81 | Temp 98.1°F | Ht 70.5 in | Wt 300.4 lb

## 2018-01-13 DIAGNOSIS — Z136 Encounter for screening for cardiovascular disorders: Secondary | ICD-10-CM | POA: Diagnosis not present

## 2018-01-13 DIAGNOSIS — Z0001 Encounter for general adult medical examination with abnormal findings: Secondary | ICD-10-CM

## 2018-01-13 DIAGNOSIS — E669 Obesity, unspecified: Secondary | ICD-10-CM

## 2018-01-13 DIAGNOSIS — F419 Anxiety disorder, unspecified: Secondary | ICD-10-CM | POA: Diagnosis not present

## 2018-01-13 DIAGNOSIS — Z13220 Encounter for screening for lipoid disorders: Secondary | ICD-10-CM

## 2018-01-13 DIAGNOSIS — Z113 Encounter for screening for infections with a predominantly sexual mode of transmission: Secondary | ICD-10-CM | POA: Diagnosis not present

## 2018-01-13 LAB — CBC WITH DIFFERENTIAL/PLATELET
Basophils Absolute: 0.1 10*3/uL (ref 0.0–0.1)
Basophils Relative: 2.8 % (ref 0.0–3.0)
EOS ABS: 0.1 10*3/uL (ref 0.0–0.7)
Eosinophils Relative: 1.7 % (ref 0.0–5.0)
HCT: 43.4 % (ref 39.0–52.0)
Hemoglobin: 14.5 g/dL (ref 13.0–17.0)
LYMPHS PCT: 45.4 % (ref 12.0–46.0)
Lymphs Abs: 1.6 10*3/uL (ref 0.7–4.0)
MCHC: 33.5 g/dL (ref 30.0–36.0)
MCV: 88 fl (ref 78.0–100.0)
Monocytes Absolute: 0.3 10*3/uL (ref 0.1–1.0)
Monocytes Relative: 8.2 % (ref 3.0–12.0)
NEUTROS ABS: 1.5 10*3/uL (ref 1.4–7.7)
Neutrophils Relative %: 41.9 % — ABNORMAL LOW (ref 43.0–77.0)
Platelets: 277 10*3/uL (ref 150.0–400.0)
RBC: 4.93 Mil/uL (ref 4.22–5.81)
RDW: 13.7 % (ref 11.5–15.5)
WBC: 3.6 10*3/uL — ABNORMAL LOW (ref 4.0–10.5)

## 2018-01-13 LAB — LIPID PANEL
Cholesterol: 165 mg/dL (ref 0–200)
HDL: 56.5 mg/dL (ref >39.00)
LDL Cholesterol: 79 mg/dL (ref 0–99)
NonHDL: 108.25
Total CHOL/HDL Ratio: 3
Triglycerides: 145 mg/dL (ref 0.0–149.0)
VLDL: 29 mg/dL (ref 0.0–40.0)

## 2018-01-13 LAB — COMPREHENSIVE METABOLIC PANEL
ALT: 55 U/L — ABNORMAL HIGH (ref 0–53)
AST: 41 U/L — AB (ref 0–37)
Albumin: 4.4 g/dL (ref 3.5–5.2)
Alkaline Phosphatase: 80 U/L (ref 39–117)
BUN: 12 mg/dL (ref 6–23)
CO2: 28 mEq/L (ref 19–32)
CREATININE: 0.95 mg/dL (ref 0.40–1.50)
Calcium: 9.6 mg/dL (ref 8.4–10.5)
Chloride: 104 mEq/L (ref 96–112)
GFR: 119.52 mL/min (ref >60.00)
Glucose, Bld: 93 mg/dL (ref 70–99)
Potassium: 4.5 mEq/L (ref 3.5–5.1)
SODIUM: 138 meq/L (ref 135–145)
Total Bilirubin: 0.4 mg/dL (ref 0.2–1.2)
Total Protein: 7.5 g/dL (ref 6.0–8.3)

## 2018-01-13 MED ORDER — SERTRALINE HCL 25 MG PO TABS: 25.0000 mg | ORAL_TABLET | Freq: Every day | ORAL | 1 refills | Status: DC

## 2018-01-13 MED ORDER — PROAIR RESPICLICK 108 (90 BASE) MCG/ACT IN AEPB: 1.0000 | INHALATION_SPRAY | RESPIRATORY_TRACT | 1 refills | Status: DC | PRN

## 2018-01-13 NOTE — Patient Instructions (Signed)
It was great to see you!  Please go to the lab for blood work.   Our office will call you with your results unless you have chosen to receive results via MyChart.  Start Zoloft 25 mg daily. Follow-up with me in 4-6 weeks to discuss this medication.  Take care,  [facility] Maintenance, Male A healthy lifestyle and preventive care is important for your health and wellness. Ask your health care provider about what schedule of regular examinations is right for you. What should I know about weight and diet? Eat a Healthy Diet  Eat plenty of vegetables, fruits, whole grains, low-fat dairy products, and lean protein.  Do not eat a lot of foods high in solid fats, added sugars, or salt.  Maintain a Healthy Weight Regular exercise can help you achieve or maintain a healthy weight. You should:  Do at least 150 minutes of exercise each week. The exercise should increase your heart rate and make you sweat (moderate-intensity exercise).  Do strength-training exercises at least twice a week.  Watch Your Levels of Cholesterol and Blood Lipids  Have your blood tested for lipids and cholesterol every 5 years starting at 30 years of age. If you are at high risk for heart disease, you should start having your blood tested when you are 30 years old. You may need to have your cholesterol levels checked more often if: ? Your lipid or cholesterol levels are high. ? You are older than 30 years of age. ? You are at high risk for heart disease.  What should I know about cancer screening? Many types of cancers can be detected early and may often be prevented. Lung Cancer  You should be screened every year for lung cancer if: ? You are a current smoker who has smoked for at least 30 years. ? You are a former smoker who has quit within the past 15 years.  Talk to your health care provider about your screening options, when you should start screening, and how often you should be  screened.  Colorectal Cancer  Routine colorectal cancer screening usually begins at 30 years of age and should be repeated every 5-10 years until you are 29 years old. You may need to be screened more often if early forms of precancerous polyps or small growths are found. Your health care provider may recommend screening at an earlier age if you have risk factors for colon cancer.  Your health care provider may recommend using home test kits to check for hidden blood in the stool.  A small camera at the end of a tube can be used to examine your colon (sigmoidoscopy or colonoscopy). This checks for the earliest forms of colorectal cancer.  Prostate and Testicular Cancer  Depending on your age and overall health, your health care provider may do certain tests to screen for prostate and testicular cancer.  Talk to your health care provider about any symptoms or concerns you have about testicular or prostate cancer.  Skin Cancer  Check your skin from head to toe regularly.  Tell your health care provider about any new moles or changes in moles, especially if: ? There is a change in a mole's size, shape, or color. ? You have a mole that is larger than a pencil eraser.  Always use sunscreen. Apply sunscreen liberally and repeat throughout the day.  Protect yourself by wearing long sleeves, pants, a wide-brimmed hat, and sunglasses when outside.  What should I know about heart  disease, diabetes, and high blood pressure?  If you are 96-58 years of age, have your blood pressure checked every 3-5 years. If you are 44 years of age or older, have your blood pressure checked every year. You should have your blood pressure measured twice-once when you are at a hospital or clinic, and once when you are not at a hospital or clinic. Record the average of the two measurements. To check your blood pressure when you are not at a hospital or clinic, you can use: ? An automated blood pressure machine at a  pharmacy. ? A home blood pressure monitor.  Talk to your health care provider about your target blood pressure.  If you are between 44-80 years old, ask your health care provider if you should take aspirin to prevent heart disease.  Have regular diabetes screenings by checking your fasting blood sugar level. ? If you are at a normal weight and have a low risk for diabetes, have this test once every three years after the age of 37. ? If you are overweight and have a high risk for diabetes, consider being tested at a younger age or more often.  A one-time screening for abdominal aortic aneurysm (AAA) by ultrasound is recommended for men aged 65-75 years who are current or former smokers. What should I know about preventing infection? Hepatitis B If you have a higher risk for hepatitis B, you should be screened for this virus. Talk with your health care provider to find out if you are at risk for hepatitis B infection. Hepatitis C Blood testing is recommended for:  Everyone born from [birth year] through 1963.  Anyone with known risk factors for hepatitis C.  Sexually Transmitted Diseases (STDs)  You should be screened each year for STDs including gonorrhea and chlamydia if: ? You are sexually active and are younger than 30 years of age. ? You are older than 30 years of age and your health care provider tells you that you are at risk for this type of infection. ? Your sexual activity has changed since you were last screened and you are at an increased risk for chlamydia or gonorrhea. Ask your health care provider if you are at risk.  Talk with your health care provider about whether you are at high risk of being infected with HIV. Your health care provider may recommend a prescription medicine to help prevent HIV infection.  What else can I do?  Schedule regular health, dental, and eye exams.  Stay current with your vaccines (immunizations).  Do not use any tobacco products, such as  cigarettes, chewing tobacco, and e-cigarettes. If you need help quitting, ask your health care provider.  Limit alcohol intake to no more than 2 drinks per day. One drink equals 12 ounces of beer, 5 ounces of wine, or 1 ounces of hard liquor.  Do not use street drugs.  Do not share needles.  Ask your health care provider for help if you need support or information about quitting drugs.  Tell your health care provider if you often feel depressed.  Tell your health care provider if you have ever been abused or do not feel safe at home. This information is not intended to replace advice given to you by your health care provider. Make sure you discuss any questions you have with your health care provider. Document Released: 10/19/2007 Document Revised: 12/20/2015 Document Reviewed: 01/24/2015 Elsevier Interactive Patient Education  [employer].

## 2018-01-13 NOTE — Progress Notes (Signed)
I acted as a Neurosurgeon for [employer], PA-C Corky Mull, LPN  Subjective:    Alexander Morrison is a 32 y.o. male and is here for a comprehensive physical exam.  HPI  There are no preventive care reminders to display for this patient.  Acute Concerns: Depression and anxiety -- has had issues with panic attacks for several years but finds that it is increasing in frequency. Planning a wedding with his fiance for this fall. Having difficulty finding time for therapy. Having panic attacks 3-4 times a week.   Chronic Issues: Asthma -- well controlled with Respiclick inhaler, uses very seldomly Obesity -- has gained about 12 lb x 1 year, attributes to smoking cessation  Health Maintenance: Immunizations -- UTD Colonoscopy -- N/A PSA -- N/A Diet -- pescatarian Caffeine intake -- green tea extract Sleep habits -- sleeping much better, using CBD Exercise -- started back exercising Weight -- Weight: (!) 300 lb 6.1 oz (136.3 kg)  Weight history Wt Readings from Last 10 Encounters:  01/13/18 (!) 300 lb 6.1 oz (136.3 kg)  10/21/16 278 lb 4 oz (126.2 kg)  09/20/13 290 lb (131.5 kg)  09/02/12 293 lb (132.9 kg)   Tobacco use -- former Alcohol use --- limited intake, bottle of wine a week  Depression screen PHQ 2/9 01/13/2018  Decreased Interest 3  Down, Depressed, Hopeless 1  PHQ - 2 Score 4  Altered sleeping 2  Tired, decreased energy 2  Change in appetite 3  Feeling bad or failure about yourself  2  Trouble concentrating 1  Moving slowly or fidgety/restless 3  Suicidal thoughts 0  PHQ-9 Score 17  Difficult doing work/chores Somewhat difficult   Other providers/specialists: Not UTD with eye doctor  PMHx, SurgHx, SocialHx, Medications, and Allergies were reviewed in the Visit Navigator and updated as appropriate.   Past Medical History:  Diagnosis Date  . Allergy      Past Surgical History:  Procedure Laterality Date  . APPENDECTOMY    . HIP PINNING        Family History  Problem Relation Age of Onset  . Hypertension Mother   . Hypertension Brother   . Lymphoma Brother   . Hypertension Father   . Alzheimer's disease Maternal Grandmother   . Diabetes Other   . CAD Other   . Colon cancer Maternal Grandfather   . Hypertension Paternal Grandmother   . Heart disease Paternal Grandmother   . Diabetes Paternal Grandmother   . Prostate cancer Neg Hx     Social History   Tobacco Use  . Smoking status: Former Smoker    Types: Cigarettes    Last attempt to quit: 09/01/2012    Years since quitting: 5.3  . Smokeless tobacco: Never Used  Substance Use Topics  . Alcohol use: Yes    Alcohol/week: 2.0 standard drinks    Types: 2 Shots of liquor per week  . Drug use: Yes    Frequency: 3.0 times per week    Types: Marijuana    Review of Systems:   Review of Systems  Constitutional: Negative.  Negative for chills, fever, malaise/fatigue and weight loss.  HENT: Negative.  Negative for hearing loss, sinus pain and sore throat.   Eyes: Negative.  Negative for blurred vision.  Respiratory: Negative.  Negative for cough and shortness of breath.   Cardiovascular: Negative.  Negative for chest pain, palpitations and leg swelling.  Gastrointestinal: Positive for heartburn. Negative for abdominal pain, constipation, diarrhea, nausea and vomiting.  Genitourinary:  Negative.  Negative for dysuria, frequency and urgency.  Musculoskeletal: Negative.  Negative for back pain, myalgias and neck pain.  Skin: Negative.  Negative for itching and rash.  Neurological: Negative.  Negative for dizziness, tingling, seizures, loss of consciousness and headaches.  Endo/Heme/Allergies: Negative.  Negative for polydipsia.  Psychiatric/Behavioral: Negative for depression. The patient is nervous/anxious.     Objective:   Vitals:   01/13/18 1056  BP: 128/88  Pulse: 81  Temp: 98.1 F (36.7 C)  SpO2: 97%   Body mass index is 42.49 kg/m.  General  Appearance:  Alert, cooperative, no distress, appears stated age  Head:  Normocephalic, without obvious abnormality, atraumatic  Eyes:  PERRL, conjunctiva/corneas clear, EOM's intact, fundi benign, both eyes       Ears:  Normal TM's and external ear canals, both ears  Nose: Nares normal, septum midline, mucosa normal, no drainage    or sinus tenderness  Throat: Lips, mucosa, and tongue normal; teeth and gums normal  Neck: Supple, symmetrical, trachea midline, no adenopathy; thyroid:  No enlargement/tenderness/nodules; no carotit bruit or JVD  Back:   Symmetric, no curvature, ROM normal, no CVA tenderness  Lungs:   Clear to auscultation bilaterally, respirations unlabored  Chest wall:  No tenderness or deformity  Heart:  Regular rate and rhythm, S1 and S2 normal, no murmur, rub   or gallop  Abdomen:   Soft, non-tender, bowel sounds active all four quadrants, no masses, no organomegaly  Extremities: Extremities normal, atraumatic, no cyanosis or edema  Prostate: Not done.   Skin: Skin color, texture, turgor normal, no rashes or lesions  Lymph nodes: Cervical, supraclavicular, and axillary nodes normal  Neurologic: CNII-XII grossly intact. Normal strength, sensation and reflexes throughout    Assessment/Plan:   Alexander Morrison was seen today for annual exam.  Diagnoses and all orders for this visit:  Encounter for general adult medical examination with abnormal findings Today patient counseled on age appropriate routine health concerns for screening and prevention, each reviewed and up to date or declined. Immunizations reviewed and up to date or declined. Labs ordered and reviewed. Risk factors for depression reviewed and negative. Hearing function and visual acuity are intact. ADLs screened and addressed as needed. Functional ability and level of safety reviewed and appropriate. Education, counseling and referrals performed based on assessed risks today. Patient provided with a copy of personalized  plan for preventive services.  Obesity, unspecified classification, unspecified obesity type, unspecified whether serious comorbidity present Discussed need for weight loss. Continue to work on diet and exercise as able. -     CBC with Differential/Platelet -     Comprehensive metabolic panel  Anxiety Discussed finding a therapist, which he is agreeable to. Also discussed starting Zoloft 25 mg given the frequency of his panic attacks. He is agreeable to this, plan to follow-up in 4-6 weeks, sooner if needed.  -     CBC with Differential/Platelet -     Comprehensive metabolic panel  Screening examination for STD (sexually transmitted disease) -     Urine cytology ancillary only -     HIV antibody (with reflex) -     RPR  Encounter for lipid screening for cardiovascular disease -     Lipid panel  Other orders -     PROAIR RESPICLICK 108 (90 Base) MCG/ACT AEPB; Inhale 1-2 puffs into the lungs as needed. -     sertraline (ZOLOFT) 25 MG tablet; Take 1 tablet (25 mg total) by mouth daily.    Well  Adult Exam: Labs ordered: Yes. Patient counseling was done. See below for items discussed. Discussed the patient's BMI.  The BMI BMI is not in the acceptable range; BMI management plan is completed Follow up in 6 weeks.  Patient Counseling: [x]   Nutrition: Stressed importance of moderation in sodium/caffeine intake, saturated fat and cholesterol, caloric balance, sufficient intake of fresh fruits, vegetables, and fiber.  [x]   Stressed the importance of regular exercise.   []   Substance Abuse: Discussed cessation/primary prevention of tobacco, alcohol, or other drug use; driving or other dangerous activities under the influence; availability of treatment for abuse.   [x]   Injury prevention: Discussed safety belts, safety helmets, smoke detector, smoking near bedding or upholstery.   []   Sexuality: Discussed sexually transmitted diseases, partner selection, use of condoms, avoidance of unintended  pregnancy  and contraceptive alternatives.   [x]   Dental health: Discussed importance of regular tooth brushing, flossing, and dental visits.  [x]   Health maintenance and immunizations reviewed. Please refer to Health maintenance section.    CMA or LPN served as scribe during this visit. History, Physical, and Plan performed by medical provider. The above documentation has been reviewed and is accurate and complete.   Jarold Motto, PA-C  [facility]

## 2018-01-14 LAB — RPR: RPR: NONREACTIVE

## 2018-01-14 LAB — URINE CYTOLOGY ANCILLARY ONLY
Chlamydia: NEGATIVE
Neisseria Gonorrhea: NEGATIVE
TRICH (WINDOWPATH): NEGATIVE

## 2018-01-14 LAB — HIV ANTIBODY (ROUTINE TESTING W REFLEX): HIV 1&2 Ab, 4th Generation: NONREACTIVE

## 2018-02-17 ENCOUNTER — Ambulatory Visit (INDEPENDENT_AMBULATORY_CARE_PROVIDER_SITE_OTHER): Payer: BLUE CROSS/BLUE SHIELD | Admitting: Physician Assistant

## 2018-02-17 ENCOUNTER — Encounter: Payer: Self-pay | Admitting: Physician Assistant

## 2018-02-17 VITALS — BP 122/86 | HR 70 | Temp 98.6°F | Ht 70.5 in | Wt 300.2 lb

## 2018-02-17 DIAGNOSIS — F339 Major depressive disorder, recurrent, unspecified: Secondary | ICD-10-CM | POA: Diagnosis not present

## 2018-02-17 MED ORDER — SERTRALINE HCL 25 MG PO TABS: 25.0000 mg | ORAL_TABLET | Freq: Every day | ORAL | 1 refills | Status: DC

## 2018-02-17 NOTE — Patient Instructions (Signed)
It was great to see you!  Continue Zoloft 25 mg daily.  If you decide to come off Zoloft, we need to taper it. If you decide to do this, cut your tablets in half and take daily x 2 weeks. Then, you should take a half a tablet every other day for 2 weeks. Then stop the medication.  Let's follow-up in 6 months, sooner if you have concerns.  Take care,  Jarold Motto PA-C

## 2018-02-17 NOTE — Progress Notes (Signed)
Alexander Morrison is a 32 y.o. male here for a follow up of a pre-existing problem.  History of Present Illness:   Chief Complaint  Patient presents with  . Depression    1 month follow up    HPI   He was started on Zoloft 25 mg at last visit. Since that time his panic attacks have essentially stopped. He does note that he feels like he is less creative and somewhat "colorless", but other than that has had no real issues with medication. Denies: change in appetite, SI/HI, unusual HA, decreased libido.   Depression screen [facility] 2/9 02/17/2018 01/13/2018 10/21/2016  Decreased Interest 2 3 1   Down, Depressed, Hopeless 1 1 1   PHQ - 2 Score 3 4 2   Altered sleeping 2 2 0  Tired, decreased energy 2 2 0  Change in appetite 1 3 2   Feeling bad or failure about yourself  2 2 2   Trouble concentrating 0 1 0  Moving slowly or fidgety/restless 0 3 0  Suicidal thoughts 0 0 0  PHQ-9 Score 10 17 6   Difficult doing work/chores Not difficult at all Somewhat difficult Somewhat difficult     Past Medical History:  Diagnosis Date  . Allergy      Social History   Socioeconomic History  . Marital status: Single    Spouse name: Not on file  . Number of children: Not on file  . Years of education: Not on file  . Highest education level: Not on file  Occupational History  . Not on file  Social Needs  . Financial resource strain: Not on file  . Food insecurity:    Worry: Not on file    Inability: Not on file  . Transportation needs:    Medical: Not on file    Non-medical: Not on file  Tobacco Use  . Smoking status: Former Smoker    Types: Cigarettes    Last attempt to quit: 09/01/2012    Years since quitting: 5.4  . Smokeless tobacco: Never Used  Substance and Sexual Activity  . Alcohol use: Yes    Alcohol/week: 2.0 standard drinks    Types: 2 Shots of liquor per week  . Drug use: Yes    Frequency: 3.0 times per week    Types: Marijuana  . Sexual activity: Yes  Lifestyle  . Physical  activity:    Days per week: Not on file    Minutes per session: Not on file  . Stress: Not on file  Relationships  . Social connections:    Talks on phone: Not on file    Gets together: Not on file    Attends religious service: Not on file    Active member of club or organization: Not on file    Attends meetings of clubs or organizations: Not on file    Relationship status: Not on file  . Intimate partner violence:    Fear of current or ex partner: Not on file    Emotionally abused: Not on file    Physically abused: Not on file    Forced sexual activity: Not on file  Other Topics Concern  . 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 APPENDECTOMY    . HIP PINNING      Family History  Problem Relation Age of Onset  . Hypertension Mother   . Hypertension Brother   . Lymphoma Brother   . Hypertension Father   . Alzheimer's disease Maternal Grandmother   . Diabetes Other   . CAD Other   . Colon cancer Maternal Grandfather   . Hypertension Paternal Grandmother   . Heart disease Paternal Grandmother   . Diabetes Paternal Grandmother   . Prostate cancer Neg Hx     Allergies  Allergen Reactions  . Shellfish Allergy Anaphylaxis  . Iodine Swelling    Current Medications:   Current Outpatient Medications:  .  beclomethasone (QVAR) 80 MCG/ACT inhaler, Inhale 1 puff into the lungs 2 (two) times daily., Disp: 1 Inhaler, Rfl: 2 .  calcium carbonate (TUMS - DOSED IN MG ELEMENTAL CALCIUM) 500 MG chewable tablet, Chew 1 tablet by mouth as needed for indigestion or heartburn., Disp: , Rfl:  .  fluticasone (FLONASE) 50 MCG/ACT nasal spray, Place 1 spray into both nostrils daily., Disp: , Rfl:  .  fluticasone (FLOVENT HFA) 44 MCG/ACT inhaler, Inhale 2 puffs into the lungs 2 (two) times daily., Disp: 1 Inhaler, Rfl: 12 .   loratadine (CLARITIN) 10 MG tablet, Take 10 mg by mouth daily., Disp: , Rfl:  .  PROAIR RESPICLICK 108 (90 Base) MCG/ACT AEPB, Inhale 1-2 puffs into the lungs as needed., Disp: 1 each, Rfl: 1 .  sertraline (ZOLOFT) 25 MG tablet, Take 1 tablet (25 mg total) by mouth daily., Disp: 90 tablet, Rfl: 1   Review of Systems:   ROS Negative unless otherwise specified per HPI.  Vitals:   Vitals:   02/17/18 1011  BP: 122/86  Pulse: 70  Temp: 98.6 F (37 C)  TempSrc: Oral  SpO2: 97%  Weight: (!) 300 lb 3.2 oz (136.2 kg)  Height: 5' 10.5" (1.791 m)     Body mass index is 42.47 kg/m.  Physical Exam:   Physical Exam  Constitutional: He appears well-developed. He is cooperative.  Non-toxic appearance. He does not have a sickly appearance. He does not appear ill. No distress.  Cardiovascular: Normal rate, regular rhythm, S1 normal, S2 normal, normal heart sounds and normal pulses.  No LE edema  Pulmonary/Chest: Effort normal and breath sounds normal.  Neurological: He is alert. GCS eye subscore is 4. GCS verbal subscore is 5. GCS motor subscore is 6.  Skin: Skin is warm, dry and intact.  Psychiatric: He has a normal mood and affect. His speech is normal and behavior is normal.  Nursing note and vitals reviewed.  Assessment and Plan:    Alexander Morrison was seen today for depression.  Diagnoses and all orders for this visit:  Depression, recurrent (HCC)  Other orders -     sertraline (ZOLOFT) 25 MG tablet; Take 1 tablet (25 mg total) by mouth daily.   No red flags on exam. Stable on Zoloft 25 mg daily. He would like to continue medication at this time. Follow-up in 6 months. I did provide instructions on tapering off medication if he decides to do so, these were included on his AVS.  . Reviewed expectations re: course of current medical issues. . Discussed self-management of symptoms. . Outlined signs and symptoms indicating need for more acute intervention. . Patient verbalized  understanding and all questions were answered. . See orders for this visit as documented in the electronic medical record. . Patient received an After-Visit Summary.  CMA or LPN served as scribe during this visit. History, Physical, and Plan performed by medical provider.  The above documentation has been reviewed and is accurate and complete.  Jarold Motto, PA-C

## 2018-03-04 ENCOUNTER — Other Ambulatory Visit: Payer: Self-pay

## 2018-03-04 ENCOUNTER — Ambulatory Visit (HOSPITAL_COMMUNITY)
Admission: EM | Admit: 2018-03-04 | Discharge: 2018-03-04 | Disposition: A | Discharge status: Home / Self Care | Payer: BLUE CROSS/BLUE SHIELD | Attending: Family Medicine | Admitting: Family Medicine

## 2018-03-04 ENCOUNTER — Encounter (HOSPITAL_COMMUNITY): Payer: Self-pay

## 2018-03-04 DIAGNOSIS — H538 Other visual disturbances: Secondary | ICD-10-CM

## 2018-03-04 NOTE — ED Triage Notes (Signed)
Pt c/o right eye has been blurry this started today.

## 2018-03-04 NOTE — Discharge Instructions (Signed)
No alarming signs on exam. As discussed, please follow up with ophthalmology for further evaluation needed tomorrow. If experiencing flashes of light, floaters, sudden vision loss, sudden bad eye pain, go to the emergency department for further evaluation needed.

## 2018-03-04 NOTE — ED Provider Notes (Signed)
[facility]    CSN: [id number] Arrival date & time: 03/04/18  1733     History   Chief Complaint Chief Complaint  Patient presents with  . Eye Problem    HPI Alexander Morrison is a 32 y.o. male.   32 year old male comes in for sudden blurry vision of the right eye earlier today.  Denies injury/trauma.  States vision feels generally blurry on the eye, but had slight slanting of objects that has not resolved.  He denies any flashes of light, floaters.  States when symptoms first started, had mild eye pain that has also since resolved.  Denies photophobia, eye redness, eye drainage.  Denies URI symptoms such as cough, congestion, sore throat.  Denies fever, chills, night sweats.  Wears glasses, last updated 2 years ago.  Does not wear contacts.  States prescription around a -3.00 to -4.00     Past Medical History:  Diagnosis Date  . Allergy     Patient Active Problem List   Diagnosis Date Noted  . Snoring 10/23/2016  . Moderate persistent asthma without complication 10/21/2016  . Depression, recurrent (HCC) 10/21/2016  . Obesity 10/21/2016  . Allergy 10/21/2016    Past Surgical History:  Procedure Laterality Date  . APPENDECTOMY    . HIP PINNING         Home Medications    Prior to Admission medications   Medication Sig Start Date End Date Taking? Authorizing Provider  beclomethasone (QVAR) 80 MCG/ACT inhaler Inhale 1 puff into the lungs 2 (two) times daily. 10/21/16   Jarold Motto, PA  calcium carbonate (TUMS - DOSED IN MG ELEMENTAL CALCIUM) 500 MG chewable tablet Chew 1 tablet by mouth as needed for indigestion or heartburn.    [provider]  fluticasone (FLONASE) 50 MCG/ACT nasal spray Place 1 spray into both nostrils daily.    [provider]  fluticasone (FLOVENT HFA) 44 MCG/ACT inhaler Inhale 2 puffs into the lungs 2 (two) times daily. 12/19/16   Jarold Motto, PA  loratadine (CLARITIN) 10 MG tablet Take 10 mg by mouth  daily.    [provider]  PROAIR RESPICLICK 108 [phone] Base) MCG/ACT AEPB Inhale 1-2 puffs into the lungs as needed. 01/13/18   Jarold Motto, PA  sertraline (ZOLOFT) 25 MG tablet Take 1 tablet (25 mg total) by mouth daily. 02/17/18   Jarold Motto, PA    Family History Family History  Problem Relation Age of Onset  . Hypertension Mother   . Hypertension Brother   . Lymphoma Brother   . Hypertension Father   . Alzheimer's disease Maternal Grandmother   . Diabetes Other   . CAD Other   . Colon cancer Maternal Grandfather   . Hypertension Paternal Grandmother   . Heart disease Paternal Grandmother   . Diabetes Paternal Grandmother   . Prostate cancer Neg Hx     Social History Social History   Tobacco Use  . Smoking status: Former Smoker    Types: Cigarettes    Last attempt to quit: 09/01/2012    Years since quitting: 5.5  . Smokeless tobacco: Never Used  Substance Use Topics  . Alcohol use: Yes    Alcohol/week: 2.0 standard drinks    Types: 2 Shots of liquor per week  . Drug use: Yes    Frequency: 3.0 times per week    Types: Marijuana     Allergies   Shellfish allergy and Iodine   Review of Systems Review of Systems  Reason unable  to perform ROS: See HPI as above.     Physical Exam Triage Vital Signs ED Triage Vitals  Enc Vitals Group     BP 03/04/18 1816 (!) 149/89     Pulse Rate 03/04/18 1816 72     Resp 03/04/18 1816 20     Temp 03/04/18 1816 97.8 F (36.6 C)     Temp Source 03/04/18 1816 Oral     SpO2 03/04/18 1816 100 %     Weight 03/04/18 1815 300 lb (136.1 kg)     Height --      Head Circumference --      Peak Flow --      Pain Score 03/04/18 1814 0     Pain Loc --      Pain Edu? --      Excl. in GC? --    No data found.  Updated Vital Signs BP (!) 149/89 (BP Location: Right Arm)   Pulse 72   Temp 97.8 F (36.6 C) (Oral)   Resp 20   Wt 300 lb (136.1 kg)   SpO2 100%   BMI 42.44 kg/m   Visual Acuity Right Eye  Distance: 20/70 with corrective lens Left Eye Distance: 20/20 with corrective lens Bilateral Distance: 20/20 with corrective lens  Right Eye Near:   Left Eye Near:    Bilateral Near:     Physical Exam  Constitutional: He is oriented to person, place, and time. He appears well-developed and well-nourished. No distress.  HENT:  Head: Normocephalic and atraumatic.  Eyes: Pupils are equal, round, and reactive to light. Conjunctivae, EOM and lids are normal. Lids are everted and swept, no foreign bodies found.  Visual field full to confrontation.   No obvious retinal hemorrhage, papilledema, retinal detachment.   Neck: Normal range of motion. Neck supple.  Neurological: He is alert and oriented to person, place, and time.  Skin: Skin is warm and dry.     UC Treatments / Results  Labs (all labs ordered are listed, but only abnormal results are displayed) Labs Reviewed - No data to display  EKG None  Radiology No results found.  Procedures Procedures (including critical care time)  Medications Ordered in UC Medications - No data to display  Initial Impression / Assessment and Plan / UC Course  I have reviewed the triage vital signs and the nursing notes.  Pertinent labs & imaging results that were available during my care of the patient were reviewed by me and considered in my medical decision making (see chart for details).    Case discussed with Dr Tracie Harrier, who also performed funduscopic exam. No obvious retinal hemorrhage, papilledema, retinal detachment. Will have patient follow up with ophthalmologist tomorrow for further evaluation needed. Strict return precautions given. Patient expresses understanding and agrees to plan.  Final Clinical Impressions(s) / UC Diagnoses   Final diagnoses:  Blurry vision, right eye    ED Prescriptions    None        Belinda Fisher, PA-C 03/04/18 1909

## 2018-07-30 ENCOUNTER — Telehealth: Payer: Self-pay | Admitting: Physician Assistant

## 2018-07-30 NOTE — Telephone Encounter (Signed)
See note  Copied from CRM [id number]. Topic: General - Other >> Jul 30, 2018  2:18 PM Gwenlyn Fudge A wrote: Reason for CRM: Pt left vm requesting refill for albuterol (PROVENTIL HFA;VENTOLIN HFA) 108 (90 BASE) MCG/ACT inhaler 2 puff   [[id number]] and sertraline (ZOLOFT) 25 MG tablet [[id number]].  [id number] [place], [place] - [phone] AT [facility] [address] [place] [address] Phone: [phone] Fax: [phone] Not a 24 hour pharmacy; exact hours not known.

## 2018-07-30 NOTE — Telephone Encounter (Signed)
Copied from CRM [id number]. Topic: General - Other >> Jul 30, 2018  2:18 PM Gwenlyn Fudge A wrote: Reason for CRM: Pt left vm requesting refill for albuterol (PROVENTIL HFA;VENTOLIN HFA) 108 (90 BASE) MCG/ACT inhaler 2 puff   [[id number]] and sertraline (ZOLOFT) 25 MG tablet [[id number]].  [id number] [place], [place] - [phone] AT [facility] [address] [place] [address] Phone: [phone] Fax: [phone] Not a 24 hour pharmacy; exact hours not known.

## 2018-07-31 NOTE — Telephone Encounter (Signed)
Please call pt and schedule a Webex. Pt has not been seen since 02/2018. Needs appt for refill.

## 2018-08-03 ENCOUNTER — Ambulatory Visit (INDEPENDENT_AMBULATORY_CARE_PROVIDER_SITE_OTHER): Payer: BLUE CROSS/BLUE SHIELD | Admitting: Physician Assistant

## 2018-08-03 ENCOUNTER — Encounter: Payer: Self-pay | Admitting: Physician Assistant

## 2018-08-03 ENCOUNTER — Other Ambulatory Visit: Payer: Self-pay | Admitting: Physician Assistant

## 2018-08-03 DIAGNOSIS — R109 Unspecified abdominal pain: Secondary | ICD-10-CM | POA: Diagnosis not present

## 2018-08-03 DIAGNOSIS — F339 Major depressive disorder, recurrent, unspecified: Secondary | ICD-10-CM | POA: Diagnosis not present

## 2018-08-03 DIAGNOSIS — J4540 Moderate persistent asthma, uncomplicated: Secondary | ICD-10-CM | POA: Diagnosis not present

## 2018-08-03 MED ORDER — SERTRALINE HCL 25 MG PO TABS: 25.0000 mg | ORAL_TABLET | Freq: Every day | ORAL | 1 refills | Status: DC

## 2018-08-03 MED ORDER — BECLOMETHASONE DIPROPIONATE 80 MCG/ACT IN AERS: 1.0000 | INHALATION_SPRAY | Freq: Two times a day (BID) | RESPIRATORY_TRACT | 2 refills | Status: DC

## 2018-08-03 NOTE — Progress Notes (Signed)
Virtual Visit via Video   I connected with Alexander Morrison on 08/03/18 at  1:20 PM EDT by a video enabled telemedicine application and verified that I am speaking with the correct person using two identifiers. Location patient: Home Location provider:  [facility], Office Persons participating in the virtual visit: SIDDH CRENSHAW, Jarold Motto, PA, Jarold Motto, PA-C   I discussed the limitations of evaluation and management by telemedicine and the availability of in person appointments. The patient expressed understanding and agreed to proceed.  Subjective:   HPI:   Asthma Currently well controlled. Takes QVAR daily when he has lots of outdoor exposures, otherwise he doesn't use it. Albuterol prn, rarely uses.  Depression Pt for follow up having some difficulty with his anxiety these days due to Coronavirus. He is not working at present. Pt is having panic attacks once a month which is well managed with supportive care -- deep breathing, removing himself from inciting event. Currently taking 25 mg zoloft daily.  Abdominal pain Pt had a gallbladder attack 2 weeks ago. He states that he has had several of them in the past, but none recently. He had an abd CT scan done about 10 years ago when he had his appendicitis and was told that he has gallstones. He has decreased the amount of fat in his diet over the past two weeks and has had no further issues. Denies: nausea, changes in stools, rectal bleeding, vomiting  ROS: See pertinent positives and negatives per HPI.  Patient Active Problem List   Diagnosis Date Noted  . Snoring 10/23/2016  . Moderate persistent asthma without complication 10/21/2016  . Depression, recurrent (HCC) 10/21/2016  . Obesity 10/21/2016  . Allergy 10/21/2016    Social History   Tobacco Use  . Smoking status: Former Smoker    Types: Cigarettes    Last attempt to quit: 09/01/2012    Years since quitting: 5.9  . Smokeless tobacco: Never Used   Substance Use Topics  . Alcohol use: Yes    Alcohol/week: 2.0 standard drinks    Types: 2 Shots of liquor per week    Current Outpatient Medications:  .  beclomethasone (QVAR) 80 MCG/ACT inhaler, Inhale 1 puff into the lungs 2 (two) times daily., Disp: 1 Inhaler, Rfl: 2 .  calcium carbonate (TUMS - DOSED IN MG ELEMENTAL CALCIUM) 500 MG chewable tablet, Chew 1 tablet by mouth as needed for indigestion or heartburn., Disp: , Rfl:  .  cetirizine (ZYRTEC) 10 MG tablet, Take 10 mg by mouth daily., Disp: , Rfl:  .  fluticasone (FLONASE) 50 MCG/ACT nasal spray, Place 1 spray into both nostrils daily., Disp: , Rfl:  .  fluticasone (FLOVENT HFA) 44 MCG/ACT inhaler, Inhale 2 puffs into the lungs 2 (two) times daily., Disp: 1 Inhaler, Rfl: 12 .  PROAIR RESPICLICK 108 (90 Base) MCG/ACT AEPB, Inhale 1-2 puffs into the lungs as needed., Disp: 1 each, Rfl: 1 .  sertraline (ZOLOFT) 25 MG tablet, Take 1 tablet (25 mg total) by mouth daily., Disp: 90 tablet, Rfl: 1  Allergies  Allergen Reactions  . Shellfish Allergy Anaphylaxis  . Iodine Swelling    Objective:   VITALS: Per patient if applicable, see vitals. GENERAL: Alert, appears well and in no acute distress. HEENT: Atraumatic, conjunctiva clear, no obvious abnormalities on inspection of external nose and ears. NECK: Normal movements of the head and neck. CARDIOPULMONARY: No increased WOB. Speaking in clear sentences. I:E ratio WNL.  MS: Moves all visible extremities without noticeable  abnormality. PSYCH: Pleasant and cooperative, well-groomed. Speech normal rate and rhythm. Affect is appropriate. Insight and judgement are appropriate. Attention is focused, linear, and appropriate.  NEURO: CN grossly intact. Oriented as arrived to appointment on time with no prompting. Moves both UE equally.  SKIN: No obvious lesions, wounds, erythema, or cyanosis noted on face or hands.  Assessment and Plan:  Will refill Qvar and Zoloft. Pt instructed to  increase Zoloft to 50 mg as needed for anxiety.  Monitor gallbladder, if symptoms worsen order Gallbladder U/S.  Kyeon was seen today for anxiety.  Diagnoses and all orders for this visit:  Moderate persistent asthma without complication Refill QVAR today. Did review current evidence regarding potential issues with steroid use and COVID-19 symptoms. Will refill QVAR for needed for asthma exacerbations and discussed indications for use.   Depression, recurrent (HCC) Stable. Denies SI/HI. Continue Zoloft 25 mg. Did discuss how to increase to 50 mg daily if needed.  Abdominal pain, unspecified abdominal location No red flags today. Difficult to work-up presently due to lack of elective surgeries and non-urgent radiology services at this time. Reviewed red flags and indication for more urgent eval.  Other orders -     beclomethasone (QVAR) 80 MCG/ACT inhaler; Inhale 1 puff into the lungs 2 (two) times daily. -     sertraline (ZOLOFT) 25 MG tablet; Take 1 tablet (25 mg total) by mouth daily.    . Reviewed expectations re: course of current medical issues. . Discussed self-management of symptoms. . Outlined signs and symptoms indicating need for more acute intervention. . Patient verbalized understanding and all questions were answered. Marland Kitchen Health Maintenance issues including appropriate healthy diet, exercise, and smoking avoidance were discussed with patient. . See orders for this visit as documented in the electronic medical record.  [place], [place] 08/03/2018

## 2018-08-18 ENCOUNTER — Telehealth: Payer: Self-pay | Admitting: *Deleted

## 2018-08-18 ENCOUNTER — Encounter: Payer: Self-pay | Admitting: Physician Assistant

## 2018-08-18 NOTE — Telephone Encounter (Signed)
Pt does not want to make virtual appt.  States he already did appt and just lost the instructions on how to taper. Would like call back or put on mychart

## 2018-08-18 NOTE — Telephone Encounter (Signed)
Samantha please see message and advise. 

## 2018-08-18 NOTE — Telephone Encounter (Signed)
Left message on voicemail to call office. Needs Doxy visit to discuss medication per [place].

## 2018-08-19 ENCOUNTER — Encounter: Payer: Self-pay | Admitting: Physician Assistant

## 2018-08-19 NOTE — Telephone Encounter (Signed)
MyChart message sent to patient.

## 2018-10-23 ENCOUNTER — Other Ambulatory Visit: Payer: Self-pay | Admitting: Physician Assistant

## 2018-10-23 ENCOUNTER — Other Ambulatory Visit: Payer: Self-pay | Admitting: Neurosurgery

## 2018-10-23 ENCOUNTER — Ambulatory Visit (INDEPENDENT_AMBULATORY_CARE_PROVIDER_SITE_OTHER): Payer: BC Managed Care – PPO

## 2018-10-23 ENCOUNTER — Encounter: Payer: Self-pay | Admitting: Physician Assistant

## 2018-10-23 ENCOUNTER — Ambulatory Visit (INDEPENDENT_AMBULATORY_CARE_PROVIDER_SITE_OTHER): Payer: BC Managed Care – PPO | Admitting: Physician Assistant

## 2018-10-23 ENCOUNTER — Other Ambulatory Visit: Payer: Self-pay

## 2018-10-23 VITALS — BP 138/80 | HR 77 | Temp 98.7°F | Ht 70.5 in | Wt 302.4 lb

## 2018-10-23 DIAGNOSIS — J4540 Moderate persistent asthma, uncomplicated: Secondary | ICD-10-CM | POA: Diagnosis not present

## 2018-10-23 DIAGNOSIS — R1011 Right upper quadrant pain: Secondary | ICD-10-CM

## 2018-10-23 DIAGNOSIS — F339 Major depressive disorder, recurrent, unspecified: Secondary | ICD-10-CM | POA: Diagnosis not present

## 2018-10-23 DIAGNOSIS — M79672 Pain in left foot: Secondary | ICD-10-CM

## 2018-10-23 LAB — CBC WITH DIFFERENTIAL/PLATELET
Basophils Absolute: 0.1 10*3/uL (ref 0.0–0.1)
Basophils Relative: 2 % (ref 0.0–3.0)
Eosinophils Absolute: 0.1 10*3/uL (ref 0.0–0.7)
Eosinophils Relative: 1.3 % (ref 0.0–5.0)
HCT: 43.2 % (ref 39.0–52.0)
Hemoglobin: 14.2 g/dL (ref 13.0–17.0)
Lymphocytes Relative: 42.3 % (ref 12.0–46.0)
Lymphs Abs: 1.7 10*3/uL (ref 0.7–4.0)
MCHC: 32.9 g/dL (ref 30.0–36.0)
MCV: 90.1 fl (ref 78.0–100.0)
Monocytes Absolute: 0.4 10*3/uL (ref 0.1–1.0)
Monocytes Relative: 9.6 % (ref 3.0–12.0)
Neutro Abs: 1.8 10*3/uL (ref 1.4–7.7)
Neutrophils Relative %: 44.8 % (ref 43.0–77.0)
Platelets: 286 10*3/uL (ref 150.0–400.0)
RBC: 4.79 Mil/uL (ref 4.22–5.81)
RDW: 13.8 % (ref 11.5–15.5)
WBC: 4 10*3/uL (ref 4.0–10.5)

## 2018-10-23 LAB — COMPREHENSIVE METABOLIC PANEL
ALT: 39 U/L (ref 0–53)
AST: 27 U/L (ref 0–37)
Albumin: 4.5 g/dL (ref 3.5–5.2)
Alkaline Phosphatase: 81 U/L (ref 39–117)
BUN: 13 mg/dL (ref 6–23)
CO2: 25 mEq/L (ref 19–32)
Calcium: 9.6 mg/dL (ref 8.4–10.5)
Chloride: 103 mEq/L (ref 96–112)
Creatinine, Ser: 0.94 mg/dL (ref 0.40–1.50)
GFR: 113.25 mL/min (ref >60.00)
Glucose, Bld: 92 mg/dL (ref 70–99)
Potassium: 4.4 mEq/L (ref 3.5–5.1)
Sodium: 137 mEq/L (ref 135–145)
Total Bilirubin: 0.5 mg/dL (ref 0.2–1.2)
Total Protein: 7.1 g/dL (ref 6.0–8.3)

## 2018-10-23 LAB — H. PYLORI ANTIBODY, IGG: H Pylori IgG: NEGATIVE

## 2018-10-23 LAB — LIPASE: Lipase: 19 U/L (ref 11.0–59.0)

## 2018-10-23 MED ORDER — PROAIR RESPICLICK 108 (90 BASE) MCG/ACT IN AEPB: 1.0000 | INHALATION_SPRAY | RESPIRATORY_TRACT | 3 refills | Status: DC | PRN

## 2018-10-23 MED ORDER — SERTRALINE HCL 25 MG PO TABS: 25.0000 mg | ORAL_TABLET | Freq: Every day | ORAL | 1 refills | Status: DC

## 2018-10-23 NOTE — Telephone Encounter (Signed)
Pharmacy requesting change of Inhaler due to not on pt's formulary. Please advise of okay?

## 2018-10-23 NOTE — Patient Instructions (Signed)
It was great to see you!  For your abdominal pain -- someone will contact you regarding your ultrasound. If worsening pain until then -- go to the ER or an Urgent Care. Cut down on the alcohol intake.  For your heel pain -- we will contact you after the xray.  I will be in touch with lab results.  Take care,  Inda Coke PA-C

## 2018-10-23 NOTE — Progress Notes (Signed)
Alexander FujitaDrew S Morrison is a 33 y.o. male here for a new problem.  I acted as a Neurosurgeonscribe for Energy East CorporationSamantha Jaycie Kregel, PA-C Corky Mullonna Orphanos, LPN  History of Present Illness:   Chief Complaint  Patient presents with  . RUQ pain  . Left heel pain    HPI   RUQ pain Pt c/o RUQ pain x 3 months, nausea off and on. No vomiting. Pain radiates to mid back and shoulders. Pt has not taken any medication. Using warm compresses to area. He has admitted to increase in ETOH since COVID-19. Drinks 3-4 drinks daily. High fat food makes his symptoms worse. Denies diarrhea.  Heel pain Pt c/o left heel pain since January, worsening in the past month. Pt has tried sole inserts, and changed shoes no relief. Denies inciting event.  Depression Continues on Zoloft 25 mg daily. Tolerating well. Denies SI/HI. Requesting refill today.  Asthma Currently well controlled, needs refill on albuterol.   Past Medical History:  Diagnosis Date  . Allergy      Social History   Socioeconomic History  . Marital status: Single    Spouse name: Not on file  . Number of children: Not on file  . Years of education: Not on file  . Highest education level: Not on file  Occupational History  . Not on file  Social Needs  . Financial resource strain: Not on file  . Food insecurity    Worry: Not on file    Inability: Not on file  . Transportation needs    Medical: Not on file    Non-medical: Not on file  Tobacco Use  . Smoking status: Former Smoker    Types: Cigarettes    Quit date: 09/01/2012    Years since quitting: 6.1  . Smokeless tobacco: Never Used  Substance and Sexual Activity  . Alcohol use: Yes    Alcohol/week: 2.0 standard drinks    Types: 2 Shots of liquor per week  . Drug use: Yes    Frequency: 3.0 times per week    Types: Marijuana  . Sexual activity: Yes  Lifestyle  . Physical activity    Days per week: Not on file    Minutes per session: Not on file  . Stress: Not on file  Relationships  . Social  Musicianconnections    Talks on phone: Not on file    Gets together: Not on file    Attends religious service: Not on file    Active member of club or organization: Not on file    Attends meetings of clubs or organizations: Not on file    Relationship status: Not on file  . Intimate partner violence    Fear of current or ex partner: Not on file    Emotionally abused: Not on file    Physically abused: Not on file    Forced sexual activity: Not on file  Other Topics Concern  . 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 APPENDECTOMY    . HIP PINNING      Family History  Problem Relation Age of Onset  . Hypertension Mother   . Hypertension Brother   . Lymphoma Brother   . Hypertension Father   . Alzheimer's disease Maternal Grandmother   . Diabetes Other   . CAD  Other   . Colon cancer Maternal Grandfather   . Hypertension Paternal Grandmother   . Heart disease Paternal Grandmother   . Diabetes Paternal Grandmother   . Prostate cancer Neg Hx     Allergies  Allergen Reactions  . Shellfish Allergy Anaphylaxis  . Iodine Swelling    Current Medications:   Current Outpatient Medications:  .  beclomethasone (QVAR REDIHALER) 80 MCG/ACT inhaler, Inhale 2 puffs into the lungs daily., Disp: 1 Inhaler, Rfl: 2 .  calcium carbonate (TUMS - DOSED IN MG ELEMENTAL CALCIUM) 500 MG chewable tablet, Chew 1 tablet by mouth as needed for indigestion or heartburn., Disp: , Rfl:  .  cetirizine (ZYRTEC) 10 MG tablet, Take 10 mg by mouth daily., Disp: , Rfl:  .  fluticasone (FLONASE) 50 MCG/ACT nasal spray, Place 1 spray into both nostrils daily., Disp: , Rfl:  .  fluticasone (FLOVENT HFA) 44 MCG/ACT inhaler, Inhale 2 puffs into the lungs 2 (two) times daily., Disp: 1 Inhaler, Rfl: 12 .  PROAIR RESPICLICK 644 (90 Base) MCG/ACT AEPB, Inhale 1-2  puffs into the lungs as needed., Disp: 1 each, Rfl: 3 .  sertraline (ZOLOFT) 25 MG tablet, Take 1 tablet (25 mg total) by mouth daily., Disp: 90 tablet, Rfl: 1   Review of Systems:   Review of Systems  Constitutional: Negative for chills, fever and weight loss.  Respiratory: Negative for cough, hemoptysis and sputum production.   Cardiovascular: Negative for chest pain, palpitations and orthopnea.  Gastrointestinal: Positive for abdominal pain and heartburn. Negative for diarrhea, nausea and vomiting.  Musculoskeletal: Positive for joint pain. Negative for back pain and neck pain.  Neurological: Negative for dizziness, tingling and headaches.  Psychiatric/Behavioral: Positive for depression. Negative for suicidal ideas. The patient is not nervous/anxious and does not have insomnia.     Vitals:   Vitals:   10/23/18 0920  BP: 138/80  Pulse: 77  Temp: 98.7 F (37.1 C)  TempSrc: Oral  SpO2: 98%  Weight: (!) 302 lb 6.1 oz (137.2 kg)  Height: 5' 10.5" (1.791 m)     Body mass index is 42.77 kg/m.  Physical Exam:   Physical Exam Vitals signs and nursing note reviewed.  Constitutional:      General: He is not in acute distress.    Appearance: He is well-developed. He is not ill-appearing or toxic-appearing.  Cardiovascular:     Rate and Rhythm: Normal rate and regular rhythm.     Pulses: Normal pulses.     Heart sounds: Normal heart sounds, S1 normal and S2 normal.     Comments: No LE edema Pulmonary:     Effort: Pulmonary effort is normal.     Breath sounds: Normal breath sounds.  Abdominal:     General: Abdomen is flat. Bowel sounds are normal.     Palpations: Abdomen is soft.     Tenderness: There is abdominal tenderness in the right upper quadrant and epigastric area. There is no right CVA tenderness, left CVA tenderness, guarding or rebound. Negative signs include Murphy's sign.  Feet:     Comments: L foot with normal ROM. No swelling or erythema evident. Point  tenderness to sole of foot near heel. No achilles tenderness. Skin:    General: Skin is warm and dry.  Neurological:     Mental Status: He is alert.     GCS: GCS eye subscore is 4. GCS verbal subscore is 5. GCS motor subscore is 6.  Psychiatric:        Speech:  Speech normal.        Behavior: Behavior normal. Behavior is cooperative.     Assessment and Plan:   Alexander GowerDrew was seen today for ruq pain and left heel pain.  Diagnoses and all orders for this visit:  Right upper quadrant abdominal pain Concern for biliary colic, gastritis, or other. He does have an increase in ETOH intake -- we discussed reduction of this as well reduced high fat foods. Labs today. U/S next week per his request. ER precautions advised in the meantime. Recommended surgery referral if positive U/S. -     Comprehensive metabolic panel -     CBC with Differential/Platelet -     Lipase -     H. pylori antibody, IgG -     US Abdomen Complete; Future  Pain of left heel Unclear etiology. Will check xray to r/o fracture. Further interventions based on xray results. -     DG Foot Complete Left; Future  Moderate persistent asthma without complication Controlled. Refill proair per patient request.  Depression, recurrent (HCC) Controlled. Refill zoloft per patient request.  Other orders -     PROAIR RESPICLICK 108 (90 Base) MCG/ACT AEPB; Inhale 1-2 puffs into the lungs as needed. -     sertraline (ZOLOFT) 25 MG tablet; Take 1 tablet (25 mg total) by mouth daily.    . Reviewed expectations re: course of current medical issues. . Discussed self-management of symptoms. . Outlined signs and symptoms indicating need for more acute intervention. . Patient verbalized understanding and all questions were answered. . See orders for this visit as documented in the electronic medical record. . Patient received an After-Visit Summary.  CMA or LPN served as scribe during this visit. History, Physical, and Plan performed by  medical provider. The above documentation has been reviewed and is accurate and complete.   Jarold MottoSamantha Kathlee Barnhardt, PA-C

## 2018-10-27 ENCOUNTER — Ambulatory Visit
Admission: RE | Admit: 2018-10-27 | Discharge: 2018-10-27 | Disposition: A | Discharge status: Home / Self Care | Payer: BC Managed Care – PPO | Source: Ambulatory Visit | Attending: Physician Assistant | Admitting: Physician Assistant

## 2018-10-27 DIAGNOSIS — R1011 Right upper quadrant pain: Secondary | ICD-10-CM

## 2018-11-02 ENCOUNTER — Other Ambulatory Visit: Payer: Self-pay | Admitting: Physician Assistant

## 2018-11-02 ENCOUNTER — Encounter: Payer: Self-pay | Admitting: Physician Assistant

## 2018-11-02 MED ORDER — ESOMEPRAZOLE MAGNESIUM 40 MG PO CPDR: 40.0000 mg | DELAYED_RELEASE_CAPSULE | Freq: Every day | ORAL | 3 refills | Status: DC

## 2018-11-10 ENCOUNTER — Encounter: Payer: Self-pay | Admitting: Physician Assistant

## 2018-11-11 ENCOUNTER — Other Ambulatory Visit: Payer: Self-pay | Admitting: Physician Assistant

## 2018-11-11 DIAGNOSIS — M79672 Pain in left foot: Secondary | ICD-10-CM

## 2018-11-11 DIAGNOSIS — E669 Obesity, unspecified: Secondary | ICD-10-CM

## 2018-11-16 ENCOUNTER — Ambulatory Visit (INDEPENDENT_AMBULATORY_CARE_PROVIDER_SITE_OTHER): Payer: BC Managed Care – PPO | Admitting: Family Medicine

## 2018-11-16 ENCOUNTER — Other Ambulatory Visit: Payer: Self-pay

## 2018-11-16 ENCOUNTER — Encounter: Payer: Self-pay | Admitting: Family Medicine

## 2018-11-16 DIAGNOSIS — M722 Plantar fascial fibromatosis: Secondary | ICD-10-CM | POA: Diagnosis not present

## 2018-11-16 NOTE — Progress Notes (Signed)
Alexander FujitaDrew S Morrison - 33 y.o. male MRN [id number]  Date of birth: 1985-07-24  Office Visit Note: Visit Date: 11/16/2018 PCP: Jarold MottoWorley, Samantha, PA Referred by: Jarold MottoWorley, Samantha, [place]  Subjective: Chief Complaint  Patient presents with   Left Foot - Pain    Intermittent pain in the heel since 02/2018. Pain with every "first step." No known injury to foot.   HPI: Alexander Morrison is a 33 y.o. male who comes in today with 8 months of heel pain. Reports that pain is worse in the morning and when he takes his first step after being seated for an extended time period. Pain improves throughout the day with walking. Denies numbness or tingling in his toes. Has not taken medication. Uses THC tincture at night before bed. Has bought new shoes with support. Denies traumatic event or new activity. Has worked at [employer] for the past 4 years and is on his feet often.  Would like to avoid new medications at this time as he is undergoing workup for liver issues.   ROS Otherwise per HPI.  Assessment & Plan: Visit Diagnoses:  1. Plantar fasciitis of left foot     Plan:  - provided calf and heel cord stretches - instructed daily icing - discussed cortisone injection if these measures do not improve pain  Follow-up: follow up PRN  Procedures: No procedures performed    Clinical History: No specialty comments available.   He reports that he quit smoking about 6 years ago. His smoking use included cigarettes. He has never used smokeless tobacco. No results for input(s): HGBA1C, LABURIC in the last 8760 hours.   Having evaluation of liver at this time. Would like to avoid new medications.   Objective:  VS:  HT:     WT:    BMI:      BP:    HR: bpm   TEMP: ( )   RESP:  Gen: well appearing Resp: comfortable breathing, conversant Neuro: no obvious defects Psych: mood congruent  Left Foot: Inspection: Pes planus. No obvious bony deformity.  No swelling, erythema, or bruising.   Palpation: TTP at  medial calcaneus at insertion of plantar fascia, worse with dorsiflexion. ROM: Full  ROM of the ankle. Limited dorsiflexion, 90 degrees. Strength: 5/5 strength ankle in all planes Neurovascular: N/V intact distally in the lower extremity Special tests: No pain with flexion of small toes against resistance. Negative squeeze. Negative tarsal tunnel sign.   Imaging: X rays of feet with pes planus, possible accessory navicular  Past Medical/Family/Surgical/Social History: Medications & Allergies reviewed per EMR, new medications updated. Patient Active Problem List   Diagnosis Date Noted   Snoring 10/23/2016   Moderate persistent asthma without complication 10/21/2016   Depression, recurrent (HCC) 10/21/2016   Obesity 10/21/2016   Allergy 10/21/2016   Past Medical History:  Diagnosis Date   Allergy    Family History  Problem Relation Age of Onset   Hypertension Mother    Hypertension Brother    Lymphoma Brother    Hypertension Father    Alzheimer's disease Maternal Grandmother    Diabetes Other    CAD Other    Colon cancer Maternal Grandfather    Hypertension Paternal Grandmother    Heart disease Paternal Grandmother    Diabetes Paternal Grandmother    Prostate cancer Neg Hx    Past Surgical History:  Procedure Laterality Date   APPENDECTOMY     HIP PINNING     Social History   Occupational History  Not on file  Tobacco Use   Smoking status: Former Smoker    Types: Cigarettes    Quit date: 09/01/2012    Years since quitting: 6.2   Smokeless tobacco: Never Used  Substance and Sexual Activity   Alcohol use: Yes    Alcohol/week: 2.0 standard drinks    Types: 2 Shots of liquor per week   Drug use: Yes    Frequency: 3.0 times per week    Types: Marijuana   Sexual activity: Yes

## 2018-11-16 NOTE — Patient Instructions (Signed)
You have plantar fascitis  Things to help your pain: 1. Put your heel in ice water daily -- 10-15 minutes at a time. 2. Perform the stretches that we give you daily for your calves and heel cord   If these do not help, we can do a cortisone injection in the area.

## 2018-11-16 NOTE — Progress Notes (Signed)
I saw and examined the patient with Dr. Mayer Masker and agree with assessment and plan as outlined.  Pain consistent with plantar fasciitis.  Will try conservative measures.  PT or injection if worsens.

## 2018-11-22 ENCOUNTER — Other Ambulatory Visit: Payer: Self-pay | Admitting: Physician Assistant

## 2018-12-27 ENCOUNTER — Other Ambulatory Visit: Payer: Self-pay | Admitting: Physician Assistant

## 2019-01-07 ENCOUNTER — Encounter: Payer: Self-pay | Admitting: Registered"

## 2019-01-07 ENCOUNTER — Other Ambulatory Visit: Payer: Self-pay

## 2019-01-07 ENCOUNTER — Encounter: Payer: BC Managed Care – PPO | Attending: Physician Assistant | Admitting: Registered"

## 2019-01-07 DIAGNOSIS — E669 Obesity, unspecified: Secondary | ICD-10-CM

## 2019-01-07 NOTE — Progress Notes (Signed)
Medical Nutrition Therapy:  Appt start time: 9:20 end time:  10:30.  Assessment:  Primary concerns today:   Pt expectations: wants to know how much protein and fiber he should eat and ways to get it  Pt states he needs a little help losing weight. Has lost 15 lbs already. States he has been a "fat kid" all his life. Has weight loss goal of being 200 lbs, currently weighing 284 lbs. Was doing intermittent fasting and gained it back. States he has been trying to eat right but fiber bars have a lot of sugar in them. Does not want to eat a lot of sugar.   States he was having some stomach discomfort and was told his liver had some fat on it. Since then looked up information to help decrease fat on liver. Stopped drinking alcohol and 2 weeks later things had improved. States he was drinking due to the pandemic.   Pt states he has been off and on with vegetarianism. Has been trying to get away from white pasta & white bread. States he feels hungry during the day and will drink water to check and see if its hunger or not. Reports he used to skip meals and working on having 3 meals a day.   Works cor [employer], 9-10 hrs days with varying work hours (8-6 pm, 12-9 pm, 10-7 pm).    Preferred Learning Style:   No preference indicated   Learning Readiness:   Ready  Change in progress   MEDICATIONS: See list. Reports also taking tumeric, ginger, and milk thistle as supplements.   DIETARY INTAKE: Allergies: shellfish Usual eating pattern includes 3 meals and 2 snacks per day.  Everyday foods include vegetables, fruit, peanut butter, smoothies, seeds (chia & pumpkin), juice, and nuts. Avoided foods include white pasta, white bread, limits dairy products, shellfish, kiwi and grapefruits. High sodium products. Sugar sweetened beverages.   24-hr recall:  B ( AM): sauteed kale + almonds + chia seeds + egg whites + 1/2 potato + green tea + cranberry juice or shake or spaghetti  Snk ( AM):   L ( PM):  green juice (w/ peanut butter powder + almond milk + cocoa powder) Snk ( PM): 3 dates + handful pumpkin seeds D ( PM): enchilada casserole (beans, vegetables) Snk ( PM): banana + peanut butter or nuts + rasberries Beverages: water (~gallon/day), green tea, juice  Usual physical activity: yard work and walking sometimes  Estimated energy needs: 2400 calories 270 g carbohydrates 180 g protein 67 g fat  Progress Towards Goal(s):  In progress.   Nutritional Diagnosis:  NB-1.1 Food and nutrition-related knowledge deficit As related to prior exposure to incorrect information.  As evidenced by pt verablizes incomplete information.    Intervention:  Nutrition education and counseling. Pt was educated and counseled on fiber and the role of nutrients in our bodies. Discussed metabolism, benefits of having consistent balanced meals throughout the day. Discussed the importance of self-care, stress management, and benefits of physical activity. Pt was in agreement with goals listed.  Goals: - Aim to have consistent balanced meals and snacks daily. See handout as reference.  - Listen to our bodies and not ignore hunger cues. Also, listen for satisfaction cues.  - Aim to increase fiber intake with whole grains, fruits, vegetables, and nuts.  Teaching Method Utilized:  Visual Auditory Hands on  Handouts given during visit include:  MyPlate for Adults  Barriers to learning/adherence to lifestyle change: none identified  Demonstrated degree of  understanding via:  Teach Back   Monitoring/Evaluation:  Dietary intake, exercise, and body weight prn.

## 2019-01-07 NOTE — Patient Instructions (Addendum)
-   Aim to have consistent balanced meals and snacks daily. See handout as reference.   - Listen to our bodies and not ignore hunger cues. Also, listen for satisfaction cues.   - Aim to increase fiber intake with whole grains, fruits, vegetables, and nuts.

## 2019-01-25 ENCOUNTER — Other Ambulatory Visit: Payer: Self-pay | Admitting: Physician Assistant

## 2019-01-25 MED ORDER — ALBUTEROL SULFATE HFA 108 (90 BASE) MCG/ACT IN AERS: 2.0000 | INHALATION_SPRAY | RESPIRATORY_TRACT | 0 refills | Status: DC | PRN

## 2019-02-10 ENCOUNTER — Encounter: Payer: Self-pay | Admitting: Physician Assistant

## 2019-02-10 NOTE — Telephone Encounter (Signed)
Sam, can I just tell him to go for testing?

## 2019-02-11 ENCOUNTER — Other Ambulatory Visit: Payer: Self-pay | Admitting: Physician Assistant

## 2019-02-22 ENCOUNTER — Other Ambulatory Visit: Payer: Self-pay

## 2019-02-22 ENCOUNTER — Other Ambulatory Visit: Payer: Self-pay | Admitting: *Deleted

## 2019-02-22 ENCOUNTER — Ambulatory Visit (HOSPITAL_COMMUNITY)
Admission: EM | Admit: 2019-02-22 | Discharge: 2019-02-22 | Disposition: A | Discharge status: Home / Self Care | Payer: BC Managed Care – PPO | Attending: Emergency Medicine | Admitting: Emergency Medicine

## 2019-02-22 ENCOUNTER — Encounter (HOSPITAL_COMMUNITY): Payer: Self-pay | Admitting: Emergency Medicine

## 2019-02-22 DIAGNOSIS — Z20822 Contact with and (suspected) exposure to covid-19: Secondary | ICD-10-CM

## 2019-02-22 DIAGNOSIS — J029 Acute pharyngitis, unspecified: Secondary | ICD-10-CM | POA: Diagnosis not present

## 2019-02-22 DIAGNOSIS — Z20828 Contact with and (suspected) exposure to other viral communicable diseases: Secondary | ICD-10-CM | POA: Diagnosis not present

## 2019-02-22 LAB — POCT RAPID STREP A: Streptococcus, Group A Screen (Direct): NEGATIVE

## 2019-02-22 NOTE — ED Provider Notes (Signed)
[facility]    CSN: [id number] Arrival date & time: 02/22/19  1230      History   Chief Complaint Chief Complaint  Patient presents with  . Sore Throat    HPI Alexander Morrison is a 33 y.o. male.   Patient presents with sore throat x2 days.  He denies difficulty swallowing, fever, chills, rash, cough, shortness of breath, vomiting, diarrhea, or other symptoms.  He went through drive-through COVID testing today; results pending.    The history is provided by the patient.    Past Medical History:  Diagnosis Date  . Allergy     Patient Active Problem List   Diagnosis Date Noted  . Snoring 10/23/2016  . Moderate persistent asthma without complication 10/21/2016  . Depression, recurrent (HCC) 10/21/2016  . Obesity 10/21/2016  . Allergy 10/21/2016    Past Surgical History:  Procedure Laterality Date  . APPENDECTOMY    . HIP PINNING         Home Medications    Prior to Admission medications   Medication Sig Start Date End Date Taking? Authorizing Provider  acetaminophen (TYLENOL) 325 MG tablet Take 650 mg by mouth every 6 (six) hours as needed.   Yes [provider]  albuterol (VENTOLIN HFA) 108 (90 Base) MCG/ACT inhaler INHALE 2 PUFFS INTO THE LUNGS EVERY 4 (FOUR) HOURS AS NEEDED FOR WHEEZING OR SHORTNESS OF BREATH. 02/11/19  Yes Jarold Motto, PA  beclomethasone (QVAR REDIHALER) 80 MCG/ACT inhaler Inhale 2 puffs into the lungs daily. 08/03/18  Yes Jarold Motto, PA  cetirizine (ZYRTEC) 10 MG tablet Take 10 mg by mouth daily.   Yes [provider]  esomeprazole (NEXIUM) 40 MG capsule TAKE 1 CAPSULE BY MOUTH EVERY DAY 12/28/18  Yes Jarold Motto, PA  fluticasone (FLONASE) 50 MCG/ACT nasal spray Place 1 spray into both nostrils daily.   Yes [provider]  phenol (CHLORASEPTIC) 1.4 % LIQD Use as directed 1 spray in the mouth or throat as needed for throat irritation / pain.   Yes [provider]  sertraline (ZOLOFT)  25 MG tablet Take 1 tablet (25 mg total) by mouth daily. 10/23/18  Yes Jarold Motto, PA  calcium carbonate (TUMS - DOSED IN MG ELEMENTAL CALCIUM) 500 MG chewable tablet Chew 1 tablet by mouth as needed for indigestion or heartburn.    [provider]  fluticasone (FLOVENT HFA) 44 MCG/ACT inhaler Inhale 2 puffs into the lungs 2 (two) times daily. 12/19/16   Jarold Motto, PA    Family History Family History  Problem Relation Age of Onset  . Hypertension Mother   . Hypertension Brother   . Lymphoma Brother   . Hypertension Father   . Alzheimer's disease Maternal Grandmother   . Diabetes Other   . CAD Other   . Cancer Other   . Stroke Other   . Colon cancer Maternal Grandfather   . Hypertension Paternal Grandmother   . Heart disease Paternal Grandmother   . Diabetes Paternal Grandmother   . Prostate cancer Neg Hx     Social History Social History   Tobacco Use  . Smoking status: Current Every Day Smoker    Types: Cigarettes    Last attempt to quit: 09/01/2012    Years since quitting: 6.4  . Smokeless tobacco: Never Used  Substance Use Topics  . Alcohol use: Yes    Alcohol/week: 2.0 standard drinks    Types: 2 Shots of liquor per week  . Drug use: Yes  Frequency: 3.0 times per week    Types: Marijuana     Allergies   Shellfish allergy and Iodine   Review of Systems Review of Systems  Constitutional: Negative for chills and fever.  HENT: Positive for sore throat. Negative for ear pain and trouble swallowing.   Eyes: Negative for pain and visual disturbance.  Respiratory: Negative for cough and shortness of breath.   Cardiovascular: Negative for chest pain and palpitations.  Gastrointestinal: Negative for abdominal pain, diarrhea, nausea and vomiting.  Genitourinary: Negative for dysuria and hematuria.  Musculoskeletal: Negative for arthralgias and back pain.  Skin: Negative for color change and rash.  Neurological: Negative for seizures and syncope.   All other systems reviewed and are negative.    Physical Exam Triage Vital Signs ED Triage Vitals  Enc Vitals Group     BP      Pulse      Resp      Temp      Temp src      SpO2      Weight      Height      Head Circumference      Peak Flow      Pain Score      Pain Loc      Pain Edu?      Excl. in GC?    No data found.  Updated Vital Signs BP 130/84 (BP Location: Left Arm) Comment: large cuff  Pulse 75   Temp 98.4 F (36.9 C) (Oral)   Resp 18   SpO2 96%   Visual Acuity Right Eye Distance:   Left Eye Distance:   Bilateral Distance:    Right Eye Near:   Left Eye Near:    Bilateral Near:     Physical Exam Vitals signs and nursing note reviewed.  Constitutional:      Appearance: He is well-developed.  HENT:     Head: Normocephalic and atraumatic.     Right Ear: Tympanic membrane normal.     Left Ear: Tympanic membrane normal.     Nose: Nose normal.     Mouth/Throat:     Mouth: Mucous membranes are moist.     Pharynx: Oropharynx is clear.  Eyes:     Conjunctiva/sclera: Conjunctivae normal.  Neck:     Musculoskeletal: Neck supple.  Cardiovascular:     Rate and Rhythm: Normal rate and regular rhythm.     Heart sounds: No murmur.  Pulmonary:     Effort: Pulmonary effort is normal. No respiratory distress.     Breath sounds: Normal breath sounds.  Abdominal:     General: Bowel sounds are normal.     Palpations: Abdomen is soft.     Tenderness: There is no abdominal tenderness. There is no guarding or rebound.  Skin:    General: Skin is warm and dry.     Findings: No rash.  Neurological:     General: No focal deficit present.     Mental Status: He is alert and oriented to person, place, and time.  Psychiatric:        Mood and Affect: Mood normal.        Behavior: Behavior normal.      UC Treatments / Results  Labs (all labs ordered are listed, but only abnormal results are displayed) Labs Reviewed  CULTURE, GROUP A STREP [facility])    EKG    Radiology No results found.  Procedures Procedures (including critical care time)  Medications Ordered in  UC Medications - No data to display  Initial Impression / Assessment and Plan / UC Course  I have reviewed the triage vital signs and the nursing notes.  Pertinent labs & imaging results that were available during my care of the patient were reviewed by me and considered in my medical decision making (see chart for details).    Sore throat.  Rapid strep negative.  Throat culture pending.  Patient had a COVID test performed at the [place] site today and his test results are pending.  Instructed patient to self quarantine until his COVID test result is back.  Instructed patient to go to the emergency department if develops high fever, shortness of breath, severe diarrhea, or other concerning symptoms.  Patient agrees with plan of care.   Final Clinical Impressions(s) / UC Diagnoses   Final diagnoses:  Sore throat     Discharge Instructions     Your rapid strep test is negative.  A throat culture is pending; we will call you if it is positive requiring treatment.       ED Prescriptions    None     PDMP not reviewed this encounter.   Sharion Balloon, NP 02/22/19 1434

## 2019-02-22 NOTE — ED Triage Notes (Signed)
Patient has a sore throat.  Patient has a slight runny nose-no different than usual.  Denies cough.   Denies fever

## 2019-02-22 NOTE — Discharge Instructions (Addendum)
Your rapid strep test is negative.  A throat culture is pending; we will call you if it is positive requiring treatment.    

## 2019-02-23 LAB — NOVEL CORONAVIRUS, NAA: SARS-CoV-2, NAA: NOT DETECTED

## 2019-02-24 LAB — CULTURE, GROUP A STREP (THRC)

## 2019-03-29 ENCOUNTER — Encounter: Payer: Self-pay | Admitting: Physician Assistant

## 2019-03-30 MED ORDER — QVAR REDIHALER 80 MCG/ACT IN AERB: 2.0000 | INHALATION_SPRAY | Freq: Every day | RESPIRATORY_TRACT | 2 refills | Status: DC

## 2019-04-11 DIAGNOSIS — Z20828 Contact with and (suspected) exposure to other viral communicable diseases: Secondary | ICD-10-CM | POA: Diagnosis not present

## 2019-04-21 ENCOUNTER — Other Ambulatory Visit: Payer: Self-pay | Admitting: Physician Assistant

## 2019-04-27 DIAGNOSIS — Z20828 Contact with and (suspected) exposure to other viral communicable diseases: Secondary | ICD-10-CM | POA: Diagnosis not present

## 2019-05-01 ENCOUNTER — Encounter: Payer: Self-pay | Admitting: Physician Assistant

## 2019-05-03 NOTE — Telephone Encounter (Signed)
Called and LVM with pt about making appt

## 2019-05-05 DIAGNOSIS — Z20828 Contact with and (suspected) exposure to other viral communicable diseases: Secondary | ICD-10-CM | POA: Diagnosis not present

## 2019-05-13 ENCOUNTER — Other Ambulatory Visit: Payer: Self-pay

## 2019-05-14 ENCOUNTER — Encounter: Payer: Self-pay | Admitting: Physician Assistant

## 2019-05-14 ENCOUNTER — Ambulatory Visit (INDEPENDENT_AMBULATORY_CARE_PROVIDER_SITE_OTHER): Payer: BC Managed Care – PPO | Admitting: Physician Assistant

## 2019-05-14 VITALS — BP 126/80 | HR 81 | Temp 98.4°F | Ht 70.5 in | Wt 296.0 lb

## 2019-05-14 DIAGNOSIS — Z0001 Encounter for general adult medical examination with abnormal findings: Secondary | ICD-10-CM

## 2019-05-14 DIAGNOSIS — F339 Major depressive disorder, recurrent, unspecified: Secondary | ICD-10-CM | POA: Diagnosis not present

## 2019-05-14 DIAGNOSIS — Z136 Encounter for screening for cardiovascular disorders: Secondary | ICD-10-CM | POA: Diagnosis not present

## 2019-05-14 DIAGNOSIS — K219 Gastro-esophageal reflux disease without esophagitis: Secondary | ICD-10-CM | POA: Diagnosis not present

## 2019-05-14 DIAGNOSIS — J4540 Moderate persistent asthma, uncomplicated: Secondary | ICD-10-CM

## 2019-05-14 DIAGNOSIS — Z720 Tobacco use: Secondary | ICD-10-CM | POA: Diagnosis not present

## 2019-05-14 DIAGNOSIS — Z13220 Encounter for screening for lipoid disorders: Secondary | ICD-10-CM | POA: Diagnosis not present

## 2019-05-14 DIAGNOSIS — Z800 Family history of malignant neoplasm of digestive organs: Secondary | ICD-10-CM

## 2019-05-14 LAB — CBC WITH DIFFERENTIAL/PLATELET
Basophils Absolute: 0.1 10*3/uL (ref 0.0–0.1)
Basophils Relative: 1.4 % (ref 0.0–3.0)
Eosinophils Absolute: 0.1 10*3/uL (ref 0.0–0.7)
Eosinophils Relative: 1.6 % (ref 0.0–5.0)
HCT: 45.2 % (ref 39.0–52.0)
Hemoglobin: 14.6 g/dL (ref 13.0–17.0)
Lymphocytes Relative: 40.2 % (ref 12.0–46.0)
Lymphs Abs: 1.7 10*3/uL (ref 0.7–4.0)
MCHC: 32.4 g/dL (ref 30.0–36.0)
MCV: 89.7 fl (ref 78.0–100.0)
Monocytes Absolute: 0.4 10*3/uL (ref 0.1–1.0)
Monocytes Relative: 9 % (ref 3.0–12.0)
Neutro Abs: 2 10*3/uL (ref 1.4–7.7)
Neutrophils Relative %: 47.8 % (ref 43.0–77.0)
Platelets: 286 10*3/uL (ref 150.0–400.0)
RBC: 5.04 Mil/uL (ref 4.22–5.81)
RDW: 13.6 % (ref 11.5–15.5)
WBC: 4.3 10*3/uL (ref 4.0–10.5)

## 2019-05-14 LAB — COMPREHENSIVE METABOLIC PANEL
ALT: 41 U/L (ref 0–53)
AST: 30 U/L (ref 0–37)
Albumin: 4.4 g/dL (ref 3.5–5.2)
Alkaline Phosphatase: 86 U/L (ref 39–117)
BUN: 14 mg/dL (ref 6–23)
CO2: 26 mEq/L (ref 19–32)
Calcium: 9.5 mg/dL (ref 8.4–10.5)
Chloride: 104 mEq/L (ref 96–112)
Creatinine, Ser: 0.98 mg/dL (ref 0.40–1.50)
GFR: 107.55 mL/min (ref >60.00)
Glucose, Bld: 96 mg/dL (ref 70–99)
Potassium: 4.1 mEq/L (ref 3.5–5.1)
Sodium: 138 mEq/L (ref 135–145)
Total Bilirubin: 0.5 mg/dL (ref 0.2–1.2)
Total Protein: 7.4 g/dL (ref 6.0–8.3)

## 2019-05-14 LAB — LIPID PANEL
Cholesterol: 166 mg/dL (ref 0–200)
HDL: 52.2 mg/dL (ref >39.00)
LDL Cholesterol: 83 mg/dL (ref 0–99)
NonHDL: 114.24
Total CHOL/HDL Ratio: 3
Triglycerides: 154 mg/dL — ABNORMAL HIGH (ref 0.0–149.0)
VLDL: 30.8 mg/dL (ref 0.0–40.0)

## 2019-05-14 MED ORDER — ESOMEPRAZOLE MAGNESIUM 40 MG PO CPDR: 40.0000 mg | DELAYED_RELEASE_CAPSULE | Freq: Every day | ORAL | 0 refills | Status: DC

## 2019-05-14 MED ORDER — SERTRALINE HCL 25 MG PO TABS: 37.5000 mg | ORAL_TABLET | Freq: Every day | ORAL | 2 refills | Status: DC

## 2019-05-14 NOTE — Patient Instructions (Signed)
It was great to see you!  Please go to the lab for blood work.   You will be contacted about referrals for a counselor and the GI doctor  Our office will call you with your results unless you have chosen to receive results via MyChart.  If your blood work is normal we will follow-up each year for physicals and as scheduled for chronic medical problems.  If anything is abnormal we will treat accordingly and get you in for a follow-up.  Take care,  [facility] Maintenance, Male Adopting a healthy lifestyle and getting preventive care are important in promoting health and wellness. Ask your health care provider about:  The right schedule for you to have regular tests and exams.  Things you can do on your own to prevent diseases and keep yourself healthy. What should I know about diet, weight, and exercise? Eat a healthy diet   Eat a diet that includes plenty of vegetables, fruits, low-fat dairy products, and lean protein.  Do not eat a lot of foods that are high in solid fats, added sugars, or sodium. Maintain a healthy weight Body mass index (BMI) is a measurement that can be used to identify possible weight problems. It estimates body fat based on height and weight. Your health care provider can help determine your BMI and help you achieve or maintain a healthy weight. Get regular exercise Get regular exercise. This is one of the most important things you can do for your health. Most adults should:  Exercise for at least 150 minutes each week. The exercise should increase your heart rate and make you sweat (moderate-intensity exercise).  Do strengthening exercises at least twice a week. This is in addition to the moderate-intensity exercise.  Spend less time sitting. Even light physical activity can be beneficial. Watch cholesterol and blood lipids Have your blood tested for lipids and cholesterol at 32 years of age, then have this test every 5 years. You may need to  have your cholesterol levels checked more often if:  Your lipid or cholesterol levels are high.  You are older than 32 years of age.  You are at high risk for heart disease. What should I know about cancer screening? Many types of cancers can be detected early and may often be prevented. Depending on your health history and family history, you may need to have cancer screening at various ages. This may include screening for:  Colorectal cancer.  Prostate cancer.  Skin cancer.  Lung cancer. What should I know about heart disease, diabetes, and high blood pressure? Blood pressure and heart disease  High blood pressure causes heart disease and increases the risk of stroke. This is more likely to develop in people who have high blood pressure readings, are of African descent, or are overweight.  Talk with your health care provider about your target blood pressure readings.  Have your blood pressure checked: ? Every 3-5 years if you are 30-55 years of age. ? Every year if you are 63 years old or older.  If you are between the ages of 12 and 5 and are a current or former smoker, ask your health care provider if you should have a one-time screening for abdominal aortic aneurysm (AAA). Diabetes Have regular diabetes screenings. This checks your fasting blood sugar level. Have the screening done:  Once every three years after age 11 if you are at a normal weight and have a low risk for diabetes.  More often  and at a younger age if you are overweight or have a high risk for diabetes. What should I know about preventing infection? Hepatitis B If you have a higher risk for hepatitis B, you should be screened for this virus. Talk with your health care provider to find out if you are at risk for hepatitis B infection. Hepatitis C Blood testing is recommended for:  Everyone born from [birth year] through 1963.  Anyone with known risk factors for hepatitis C. Sexually transmitted infections  (STIs)  You should be screened each year for STIs, including gonorrhea and chlamydia, if: ? You are sexually active and are younger than 32 years of age. ? You are older than 32 years of age and your health care provider tells you that you are at risk for this type of infection. ? Your sexual activity has changed since you were last screened, and you are at increased risk for chlamydia or gonorrhea. Ask your health care provider if you are at risk.  Ask your health care provider about whether you are at high risk for HIV. Your health care provider may recommend a prescription medicine to help prevent HIV infection. If you choose to take medicine to prevent HIV, you should first get tested for HIV. You should then be tested every 3 months for as long as you are taking the medicine. Follow these instructions at home: Lifestyle  Do not use any products that contain nicotine or tobacco, such as cigarettes, e-cigarettes, and chewing tobacco. If you need help quitting, ask your health care provider.  Do not use street drugs.  Do not share needles.  Ask your health care provider for help if you need support or information about quitting drugs. Alcohol use  Do not drink alcohol if your health care provider tells you not to drink.  If you drink alcohol: ? Limit how much you have to 0-2 drinks a day. ? Be aware of how much alcohol is in your drink. In the [place] one drink equals one 12 oz bottle of beer (355 mL), one 5 oz glass of wine (148 mL), or one 1 oz glass of hard liquor (44 mL). General instructions  Schedule regular health, dental, and eye exams.  Stay current with your vaccines.  Tell your health care provider if: ? You often feel depressed. ? You have ever been abused or do not feel safe at home. Summary  Adopting a healthy lifestyle and getting preventive care are important in promoting health and wellness.  Follow your health care provider's instructions about healthy diet,  exercising, and getting tested or screened for diseases.  Follow your health care provider's instructions on monitoring your cholesterol and blood pressure. This information is not intended to replace advice given to you by your health care provider. Make sure you discuss any questions you have with your health care provider. Document Revised: 04/15/2018 Document Reviewed: 04/15/2018 Elsevier Patient Education  2020 [employer].

## 2019-05-14 NOTE — Progress Notes (Signed)
I acted as a Education administrator for [employer], PA-C Anselmo Pickler, LPN  Subjective:    Alexander Morrison is a 34 y.o. male and is here for a comprehensive physical exam.  HPI  There are no preventive care reminders to display for this patient.  Acute Concerns: Fam history of colon cancer -- grandfather died from colon CA, recently had a few friends with fatal GI issues, he is concerned about his GI health and wants referral to GI; denies: rectal bleeding, diarrhea, constipation  Chronic Issues: Asthma -- currently on ventolin prn and QVAR BID; worsens around cats  GERD -- nexium daily; well controlled with medication Depression -- zoloft 37.5 mg (taking 1.5 tabs daily) x 2 weeks, feels better with this; is interested in seeing a counselor, would like referral today  Health Maintenance: Immunizations -- UTD Colonoscopy -- N/A PSA -- N/A Diet -- trying to eat earlier in the day, cut back on drinking; pescetarian diet right now; smaller portions Caffeine intake -- 2 x 16 oz coffee daily Sleep habits -- sleeping well - at least 7 hours Exercise -- barely has time to exercise; has active job Weight -- Weight: 296 lb (134.3 kg)  Weight history Wt Readings from Last 10 Encounters:  05/14/19 296 lb (134.3 kg)  10/23/18 (!) 302 lb 6.1 oz (137.2 kg)  03/04/18 300 lb (136.1 kg)  02/17/18 (!) 300 lb 3.2 oz (136.2 kg)  01/13/18 (!) 300 lb 6.1 oz (136.3 kg)  10/21/16 278 lb 4 oz (126.2 kg)  09/20/13 290 lb (131.5 kg)  09/02/12 293 lb (132.9 kg)   Mood -- overall good Tobacco use -- 1/2 PPD cigarettes Alcohol use --- 2 drinks per week  Depression screen [facility] 2/9 05/14/2019  Decreased Interest 1  Down, Depressed, Hopeless 1  PHQ - 2 Score 2  Altered sleeping 0  Tired, decreased energy 1  Change in appetite 1  Feeling bad or failure about yourself  1  Trouble concentrating 2  Moving slowly or fidgety/restless 0  Suicidal thoughts 0  PHQ-9 Score 7  Difficult doing work/chores Not  difficult at all     Other providers/specialists: Patient Care Team: Inda Coke, PA as PCP - General (Physician Assistant)   PMHx, SurgHx, SocialHx, Medications, and Allergies were reviewed in the Visit Navigator and updated as appropriate.   Past Medical History:  Diagnosis Date  . Allergy      Past Surgical History:  Procedure Laterality Date  . APPENDECTOMY    . HIP PINNING       Family History  Problem Relation Age of Onset  . Hypertension Mother   . Hypertension Brother   . Lymphoma Brother   . Hypertension Father   . Alzheimer's disease Maternal Grandmother   . Diabetes Other   . CAD Other   . Cancer Other   . Stroke Other   . Colon cancer Maternal Grandfather 16       late 5's  . Hypertension Paternal Grandmother   . Heart disease Paternal Grandmother   . Diabetes Paternal Grandmother   . Prostate cancer Neg Hx     Social History   Tobacco Use  . Smoking status: Current Every Day Smoker    Types: Cigarettes    Last attempt to quit: 09/01/2012    Years since quitting: 6.7  . Smokeless tobacco: Never Used  Substance Use Topics  . Alcohol use: Yes    Alcohol/week: 2.0 standard drinks    Types: 2 Shots of liquor per  week  . Drug use: Yes    Frequency: 3.0 times per week    Types: Marijuana    Review of Systems:   Review of Systems  Constitutional: Negative.  Negative for chills, fever, malaise/fatigue and weight loss.  HENT: Negative.  Negative for hearing loss, sinus pain and sore throat.   Eyes: Negative.  Negative for blurred vision.  Respiratory: Negative.  Negative for cough and shortness of breath.   Cardiovascular: Negative.  Negative for chest pain, palpitations and leg swelling.  Gastrointestinal: Positive for heartburn. Negative for abdominal pain, constipation, diarrhea, nausea and vomiting.  Genitourinary: Negative.  Negative for dysuria, frequency and urgency.  Musculoskeletal: Negative.  Negative for back pain, myalgias and  neck pain.  Skin: Negative.  Negative for itching and rash.  Neurological: Negative for dizziness, tingling, seizures and loss of consciousness.  Endo/Heme/Allergies: Negative.  Negative for polydipsia.  Psychiatric/Behavioral: Positive for depression. The patient is not nervous/anxious.     Objective:   Vitals:   05/14/19 1053  BP: 126/80  Pulse: 81  Temp: 98.4 F (36.9 C)  SpO2: 95%   Body mass index is 41.87 kg/m.  General Appearance:  Alert, cooperative, no distress, appears stated age  Head:  Normocephalic, without obvious abnormality, atraumatic  Eyes:  PERRL, conjunctiva/corneas clear, EOM's intact, fundi benign, both eyes       Ears:  Normal TM's and external ear canals, both ears  Nose: Nares normal, septum midline, mucosa normal, no drainage    or sinus tenderness  Throat: Lips, mucosa, and tongue normal; teeth and gums normal  Neck: Supple, symmetrical, trachea midline, no adenopathy; thyroid:  No enlargement/tenderness/nodules; no carotit bruit or JVD  Back:   Symmetric, no curvature, ROM normal, no CVA tenderness  Lungs:   Clear to auscultation bilaterally, respirations unlabored  Chest wall:  No tenderness or deformity  Heart:  Regular rate and rhythm, S1 and S2 normal, no murmur, rub   or gallop  Abdomen:   Soft, non-tender, bowel sounds active all four quadrants, no masses, no organomegaly  Extremities: Extremities normal, atraumatic, no cyanosis or edema  Prostate: Not done.   Skin: Skin color, texture, turgor normal, no rashes or lesions  Lymph nodes: Cervical, supraclavicular, and axillary nodes normal  Neurologic: CNII-XII grossly intact. Normal strength, sensation and reflexes throughout    Assessment/Plan:   Alexander Morrison was seen today for annual exam.  Diagnoses and all orders for this visit:  Encounter for general adult medical examination with abnormal findings Today patient counseled on age appropriate routine health concerns for screening and  prevention, each reviewed and up to date or declined. Immunizations reviewed and up to date or declined. Labs ordered and reviewed. Risk factors for depression reviewed and negative. Hearing function and visual acuity are intact. ADLs screened and addressed as needed. Functional ability and level of safety reviewed and appropriate. Education, counseling and referrals performed based on assessed risks today. Patient provided with a copy of personalized plan for preventive services.  Depression, recurrent (HCC) Continue Zoloft 37.5 mg per patient request. Will also add psychology referral per patient request. Follow-up if symptoms worsen/persist. Denies SI/HI today. -     Ambulatory referral to Psychology  Moderate persistent asthma without complication Well controlled presently.  Gastroesophageal reflux disease, unspecified whether esophagitis present Well controlled, referral to GI per patient request. -     Ambulatory referral to Gastroenterology  Family history of colon cancer Referral to GI per patient request. -     Ambulatory referral  to Gastroenterology  Encounter for lipid screening for cardiovascular disease -     Lipid panel  Tobacco abuse Brief counseling today; patient is not ready to stop smoking. -     CBC with Differential/Platelet -     Comprehensive metabolic panel  Morbid obesity (HCC) Continue to work on cleaning up diet; try to add in intentional exercise as able.  Other orders -     esomeprazole (NEXIUM) 40 MG capsule; Take 1 capsule (40 mg total) by mouth daily. -     sertraline (ZOLOFT) 25 MG tablet; Take 1.5 tablets (37.5 mg total) by mouth daily.  Well Adult Exam: Labs ordered: Yes. Patient counseling was done. See below for items discussed. Discussed the patient's BMI.  The BMI is not in the acceptable range; BMI management plan is completed Follow up in 6 months.  Patient Counseling: [x]   Nutrition: Stressed importance of moderation in sodium/caffeine  intake, saturated fat and cholesterol, caloric balance, sufficient intake of fresh fruits, vegetables, and fiber.  [x]   Stressed the importance of regular exercise.   []   Substance Abuse: Discussed cessation/primary prevention of tobacco, alcohol, or other drug use; driving or other dangerous activities under the influence; availability of treatment for abuse.   [x]   Injury prevention: Discussed safety belts, safety helmets, smoke detector, smoking near bedding or upholstery.   []   Sexuality: Discussed sexually transmitted diseases, partner selection, use of condoms, avoidance of unintended pregnancy  and contraceptive alternatives.   [x]   Dental health: Discussed importance of regular tooth brushing, flossing, and dental visits.  [x]   Health maintenance and immunizations reviewed. Please refer to Health maintenance section.    CMA or LPN served as scribe during this visit. History, Physical, and Plan performed by medical provider. The above documentation has been reviewed and is accurate and complete.  , PA-C [facility]

## 2019-05-16 DIAGNOSIS — Z20828 Contact with and (suspected) exposure to other viral communicable diseases: Secondary | ICD-10-CM | POA: Diagnosis not present

## 2019-06-14 DIAGNOSIS — Z20828 Contact with and (suspected) exposure to other viral communicable diseases: Secondary | ICD-10-CM | POA: Diagnosis not present

## 2019-07-03 DIAGNOSIS — R030 Elevated blood-pressure reading, without diagnosis of hypertension: Secondary | ICD-10-CM | POA: Diagnosis not present

## 2019-07-03 DIAGNOSIS — Z20828 Contact with and (suspected) exposure to other viral communicable diseases: Secondary | ICD-10-CM | POA: Diagnosis not present

## 2019-07-03 DIAGNOSIS — Z03818 Encounter for observation for suspected exposure to other biological agents ruled out: Secondary | ICD-10-CM | POA: Diagnosis not present

## 2019-07-12 ENCOUNTER — Encounter: Payer: Self-pay | Admitting: Physician Assistant

## 2019-11-03 ENCOUNTER — Other Ambulatory Visit: Payer: Self-pay

## 2019-11-03 ENCOUNTER — Ambulatory Visit (INDEPENDENT_AMBULATORY_CARE_PROVIDER_SITE_OTHER): Payer: BC Managed Care – PPO | Admitting: Physician Assistant

## 2019-11-03 ENCOUNTER — Encounter: Payer: Self-pay | Admitting: Physician Assistant

## 2019-11-03 VITALS — BP 122/80 | HR 72 | Temp 97.3°F | Ht 70.5 in | Wt 294.0 lb

## 2019-11-03 DIAGNOSIS — R1011 Right upper quadrant pain: Secondary | ICD-10-CM

## 2019-11-03 DIAGNOSIS — Z1159 Encounter for screening for other viral diseases: Secondary | ICD-10-CM | POA: Diagnosis not present

## 2019-11-03 LAB — COMPREHENSIVE METABOLIC PANEL
ALT: 36 U/L (ref 0–53)
AST: 26 U/L (ref 0–37)
Albumin: 4.3 g/dL (ref 3.5–5.2)
Alkaline Phosphatase: 80 U/L (ref 39–117)
BUN: 11 mg/dL (ref 6–23)
CO2: 27 mEq/L (ref 19–32)
Calcium: 9.6 mg/dL (ref 8.4–10.5)
Chloride: 103 mEq/L (ref 96–112)
Creatinine, Ser: 0.9 mg/dL (ref 0.40–1.50)
GFR: 118.3 mL/min (ref >60.00)
Glucose, Bld: 88 mg/dL (ref 70–99)
Potassium: 4.2 mEq/L (ref 3.5–5.1)
Sodium: 137 mEq/L (ref 135–145)
Total Bilirubin: 0.5 mg/dL (ref 0.2–1.2)
Total Protein: 6.9 g/dL (ref 6.0–8.3)

## 2019-11-03 LAB — LIPASE: Lipase: 25 U/L (ref 11.0–59.0)

## 2019-11-03 NOTE — Progress Notes (Signed)
Alexander Morrison is a 34 y.o. male is here to discuss: Liver enzymes  I acted as a Neurosurgeon for [employer], PA-C Corky Mull, LPN   History of Present Illness:   Chief Complaint  Patient presents with  . Abdominal Pain    HPI   Abdominal pain Pt c/o RUQ abdominal pain x 2.5 weeks (started after heavy drinking on his [birth year]), dull pain radiating to mid back area and to the right and up into shoulder blade. Slight nausea, no vomiting. Denies chest pain. Has not taken anything for pain. Pain is 6/10 at its worse. Last about 1 hour, starts 30 minutes after eating. He has tried to clean up his diet but hasn't had any changes to pain doing so.   History of mildly elevated LFTs (AST 41 and ALT 55)  last year that returned to normal on recheck.  Wt Readings from Last 5 Encounters:  11/03/19 294 lb (133.4 kg)  05/14/19 296 lb (134.3 kg)  10/23/18 (!) 302 lb 6.1 oz (137.2 kg)  03/04/18 300 lb (136.1 kg)  02/17/18 (!) 300 lb 3.2 oz (136.2 kg)   Last u/s was 10/27/18: IMPRESSION: 1. No acute or focal abnormality. No gallstones or biliary distention.  2. Increased hepatic echogenicity consistent with fatty infiltration or hepatocellular disease.  Patient was instructed to follow-up with GI but he didn't answer calls when GI office contacted him to schedule.  Health Maintenance Due  Topic Date Due  . Hepatitis C Screening  Never done    Past Medical History:  Diagnosis Date  . Allergy      Social History   Tobacco Use  . Smoking status: Current Every Day Smoker    Types: Cigarettes    Last attempt to quit: 09/01/2012    Years since quitting: 7.1  . Smokeless tobacco: Never Used  Vaping Use  . Vaping Use: Every day  Substance Use Topics  . Alcohol use: Yes    Alcohol/week: 2.0 standard drinks    Types: 2 Shots of liquor per week  . Drug use: Yes    Frequency: 3.0 times per week    Types: Marijuana    Past Surgical History:  Procedure Laterality Date    . APPENDECTOMY    . HIP PINNING      Family History  Problem Relation Age of Onset  . Hypertension Mother   . Hypertension Brother   . Lymphoma Brother   . Hypertension Father   . Alzheimer's disease Maternal Grandmother   . Diabetes Other   . CAD Other   . Cancer Other   . Stroke Other   . Colon cancer Maternal Grandfather 73       late 50's  . Hypertension Paternal Grandmother   . Heart disease Paternal Grandmother   . Diabetes Paternal Grandmother   . Prostate cancer Neg Hx     PMHx, SurgHx, SocialHx, FamHx, Medications, and Allergies were reviewed in the Visit Navigator and updated as appropriate.   Patient Active Problem List   Diagnosis Date Noted  . Snoring 10/23/2016  . Moderate persistent asthma without complication 10/21/2016  . Depression, recurrent (HCC) 10/21/2016  . Obesity 10/21/2016  . Allergy 10/21/2016    Social History   Tobacco Use  . Smoking status: Current Every Day Smoker    Types: Cigarettes    Last attempt to quit: 09/01/2012    Years since quitting: 7.1  . Smokeless tobacco: Never Used  Vaping Use  . Vaping Use:  Every day  Substance Use Topics  . Alcohol use: Yes    Alcohol/week: 2.0 standard drinks    Types: 2 Shots of liquor per week  . Drug use: Yes    Frequency: 3.0 times per week    Types: Marijuana    Current Medications and Allergies:    Current Outpatient Medications:  .  acetaminophen (TYLENOL) 325 MG tablet, Take 650 mg by mouth every 6 (six) hours as needed., Disp: , Rfl:  .  albuterol (VENTOLIN HFA) 108 (90 Base) MCG/ACT inhaler, INHALE 2 PUFFS INTO THE LUNGS EVERY 4 (FOUR) HOURS AS NEEDED FOR WHEEZING OR SHORTNESS OF BREATH., Disp: 8.5 g, Rfl: 0 .  beclomethasone (QVAR REDIHALER) 80 MCG/ACT inhaler, Inhale 2 puffs into the lungs daily., Disp: 10.6 g, Rfl: 2 .  calcium carbonate (TUMS - DOSED IN MG ELEMENTAL CALCIUM) 500 MG chewable tablet, Chew 1 tablet by mouth as needed for indigestion or heartburn., Disp: , Rfl:   .  cetirizine (ZYRTEC) 10 MG tablet, Take 10 mg by mouth daily., Disp: , Rfl:  .  fluticasone (FLONASE) 50 MCG/ACT nasal spray, Place 1 spray into both nostrils daily., Disp: , Rfl:  .  esomeprazole (NEXIUM) 40 MG capsule, Take 1 capsule (40 mg total) by mouth daily. (Patient not taking: Reported on 11/03/2019), Disp: 60 capsule, Rfl: 0   Allergies  Allergen Reactions  . Shellfish Allergy Anaphylaxis  . Iodine Swelling    Review of Systems   ROS Negative unless otherwise specified per HPI.  Vitals:   Vitals:   11/03/19 0911  BP: 122/80  Pulse: 72  Temp: (!) 97.3 F (36.3 C)  TempSrc: Temporal  SpO2: 98%  Weight: 294 lb (133.4 kg)  Height: 5' 10.5" (1.791 m)     Body mass index is 41.59 kg/m.   Physical Exam:    Physical Exam Vitals and nursing note reviewed.  Constitutional:      General: He is not in acute distress.    Appearance: He is well-developed. He is not ill-appearing or toxic-appearing.  Cardiovascular:     Rate and Rhythm: Normal rate and regular rhythm.     Pulses: Normal pulses.     Heart sounds: Normal heart sounds, S1 normal and S2 normal.     Comments: No LE edema Pulmonary:     Effort: Pulmonary effort is normal.     Breath sounds: Normal breath sounds.  Abdominal:     General: Abdomen is flat. Bowel sounds are normal.     Palpations: Abdomen is soft.     Tenderness: There is abdominal tenderness in the right upper quadrant.  Skin:    General: Skin is warm and dry.  Neurological:     Mental Status: He is alert.     GCS: GCS eye subscore is 4. GCS verbal subscore is 5. GCS motor subscore is 6.  Psychiatric:        Speech: Speech normal.        Behavior: Behavior normal. Behavior is cooperative.      Assessment and Plan:    Amon was seen today for abdominal pain.  Diagnoses and all orders for this visit:  RUQ pain Will update CMP and lipase levels today. U/S also ordered for further work-up. Patient was advised to proceed to the  ER in the interim if worsening, uncontrolled pain. Encouraged low fat diet and abstinence from alcohol until we get further information. -     Comprehensive metabolic panel -     Lipase -  US ABDOMEN LIMITED RUQ; Future  Encounter for screening for other viral diseases -     Hepatitis C antibody  . Reviewed expectations re: course of current medical issues. . Discussed self-management of symptoms. . Outlined signs and symptoms indicating need for more acute intervention. . Patient verbalized understanding and all questions were answered. . See orders for this visit as documented in the electronic medical record. . Patient received an After Visit Summary.  CMA or LPN served as scribe during this visit. History, Physical, and Plan performed by medical provider. The above documentation has been reviewed and is accurate and complete.   Jarold Motto, PA-C [place] 11/03/2019  Follow-up: No follow-ups on file.

## 2019-11-03 NOTE — Patient Instructions (Signed)
It was great to see you!  We will be in touch with your lab results. You should hear back from our office today about an ultrasound scheduled later this week.  Continue to work on healthy diet and avoid alcohol.  If worsening, severe pain in the interim, please proceed to urgent care or ER.  Take care,  Jarold Motto PA-C

## 2019-11-04 LAB — HEPATITIS C ANTIBODY
Hepatitis C Ab: NONREACTIVE
SIGNAL TO CUT-OFF: 0.04 (ref <1.00)

## 2019-11-05 ENCOUNTER — Ambulatory Visit (HOSPITAL_COMMUNITY): Payer: BC Managed Care – PPO

## 2019-12-14 DIAGNOSIS — Z20822 Contact with and (suspected) exposure to covid-19: Secondary | ICD-10-CM | POA: Diagnosis not present

## 2020-01-21 IMAGING — US ULTRASOUND ABDOMEN COMPLETE
1 series · 14 of 25 positions shown · non-contrast
Comparison: 10/13/2008.  CT 06/07/2008.

CLINICAL DATA: Right upper quadrant pain.

EXAM:
ABDOMEN ULTRASOUND COMPLETE

[Series 1: ultrasound abdomen complete · 0.25mm/px · 14 of 89 slices shown]
[im 1/89]
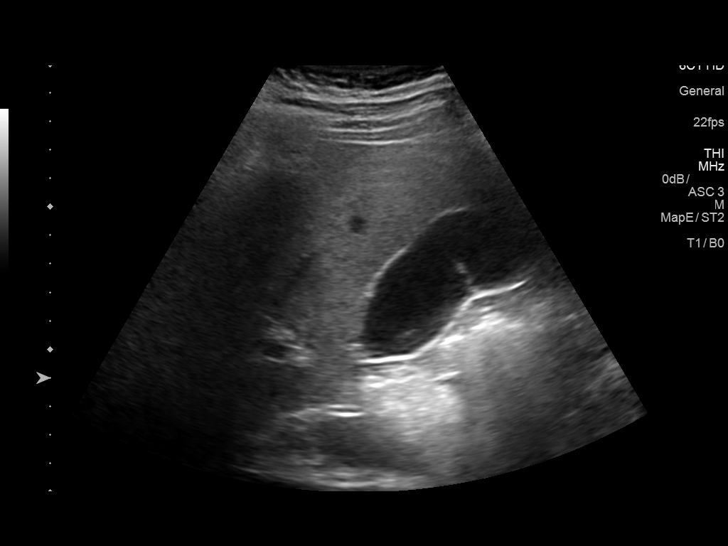
[im 8/89]
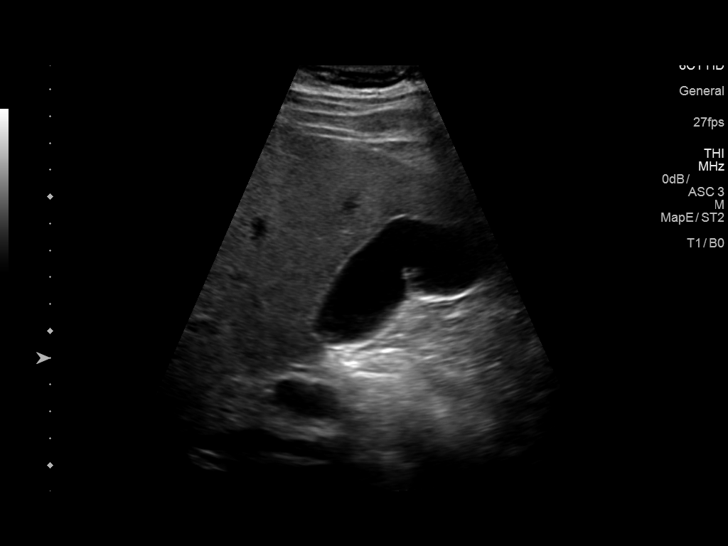
[im 15/89]
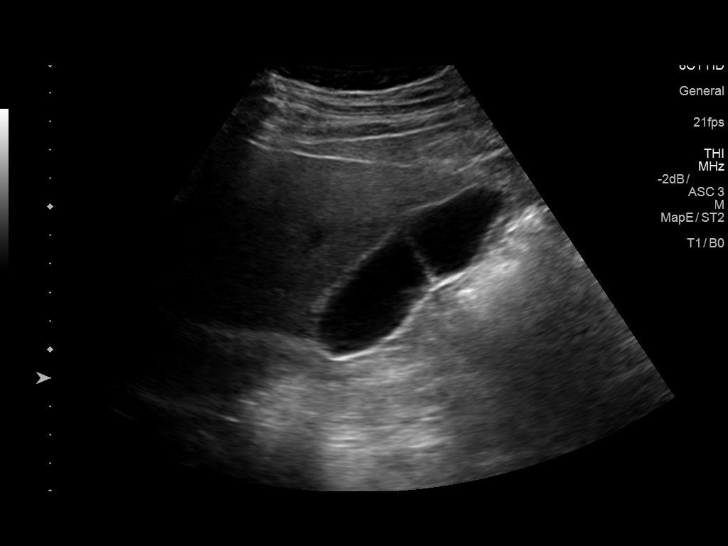
[im 23/89]
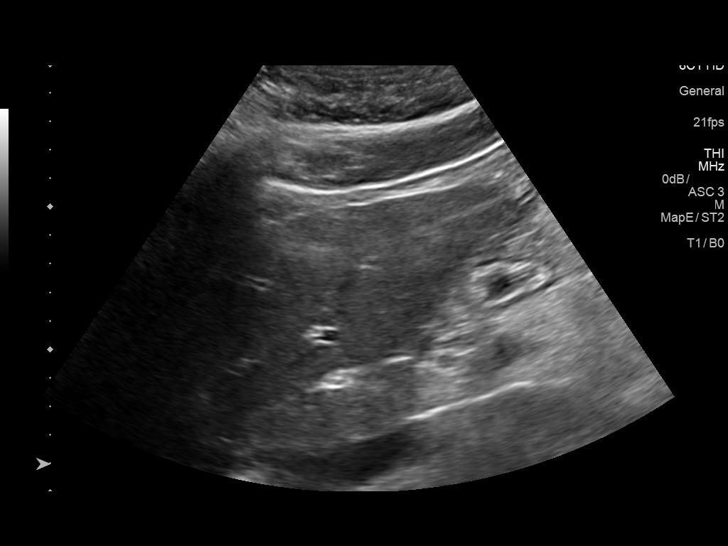
[im 30/89]
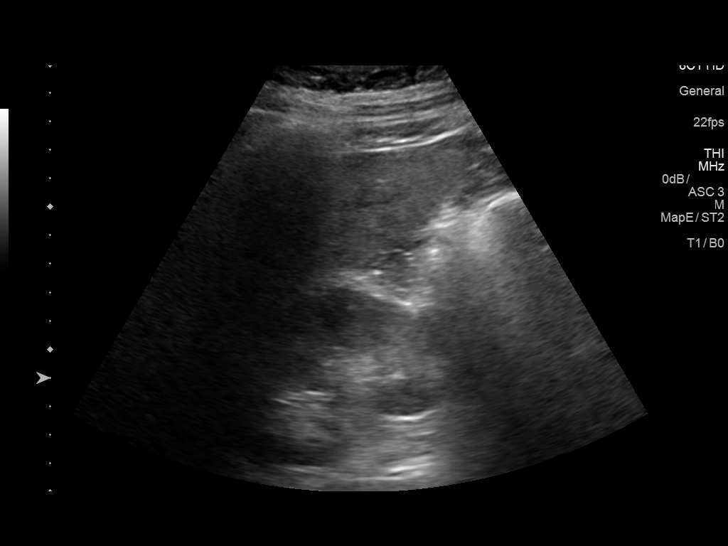
[im 34/89]
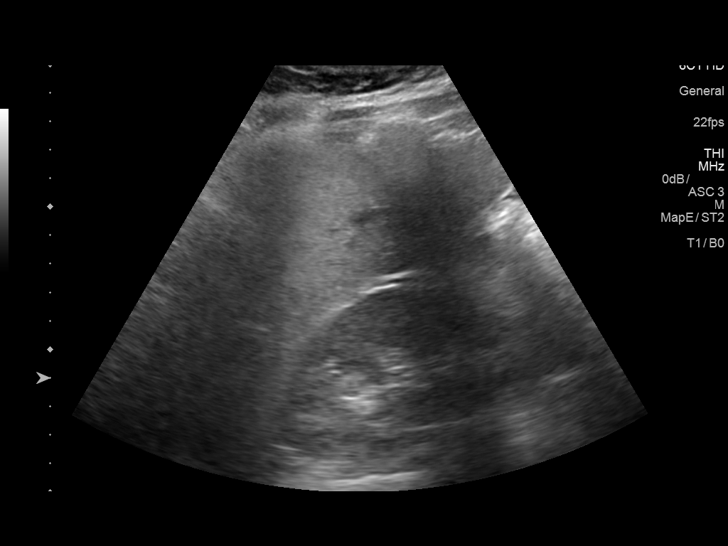
[im 41/89]
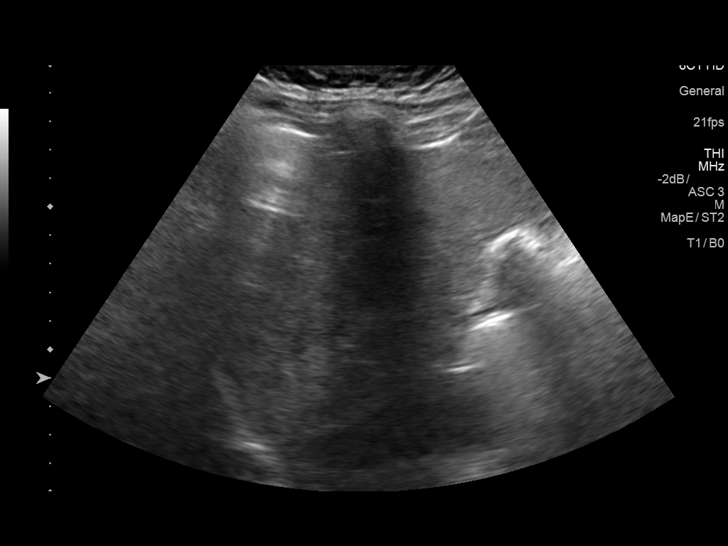
[im 48/89]
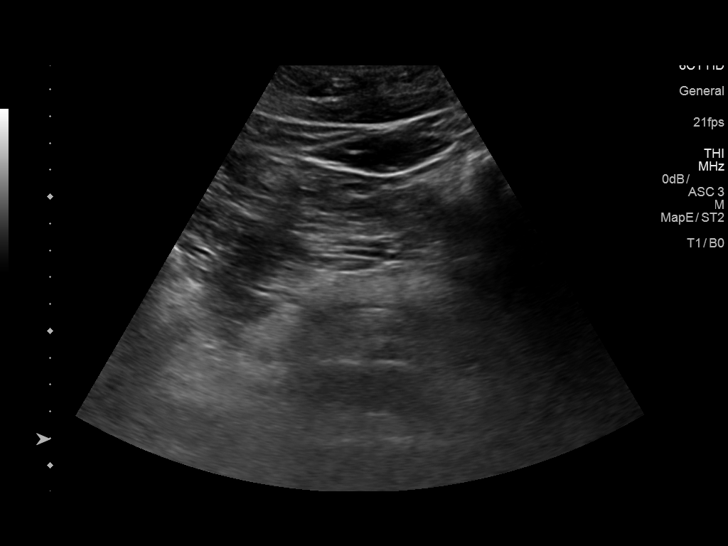
[im 56/89]
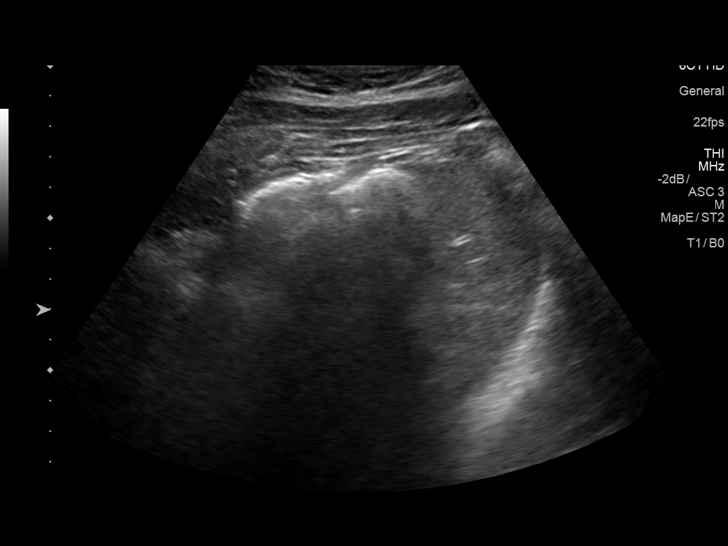
[im 59/89]
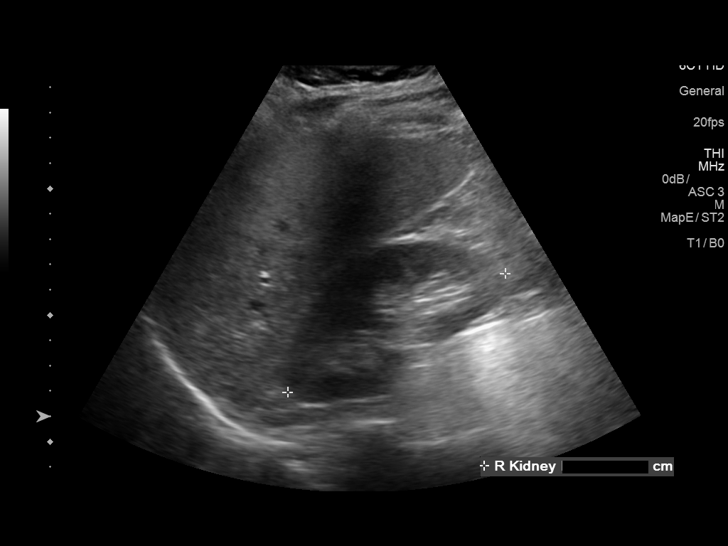
[im 67/89]
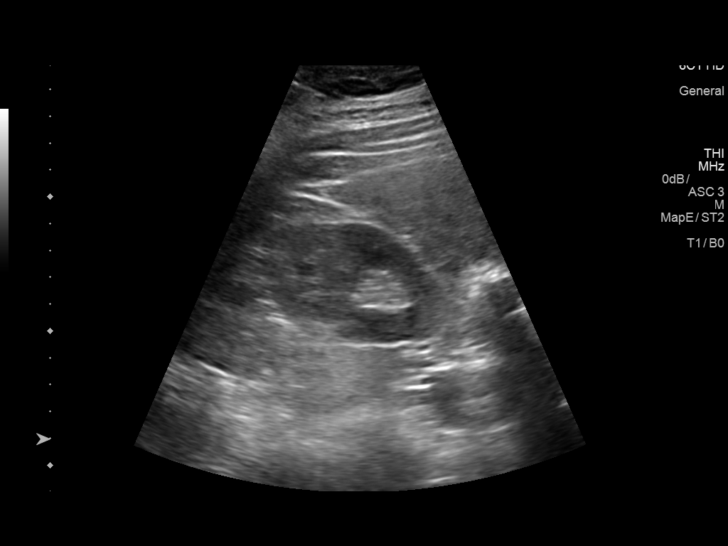
[im 74/89]
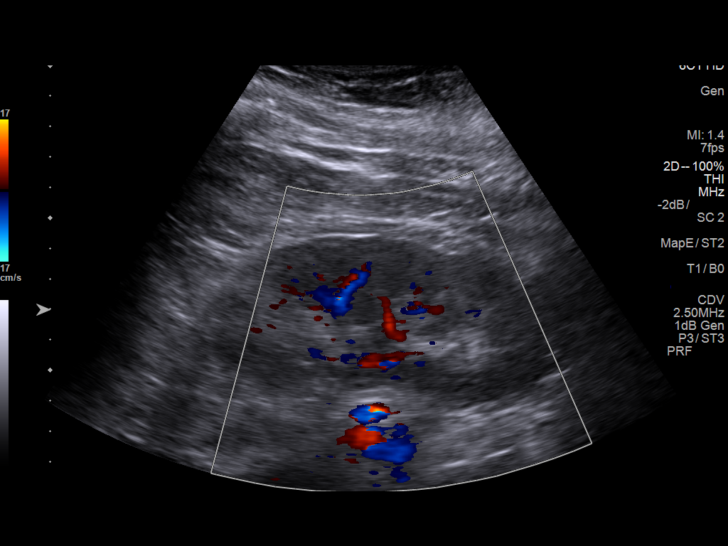
[im 81/89]
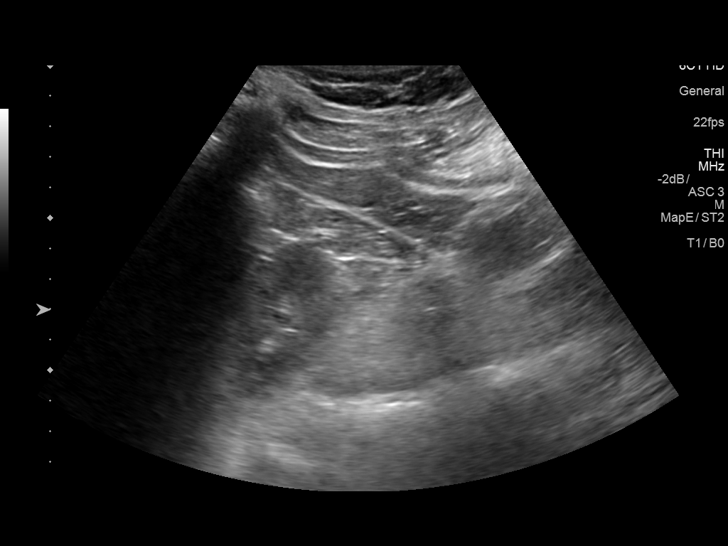
[im 89/89]
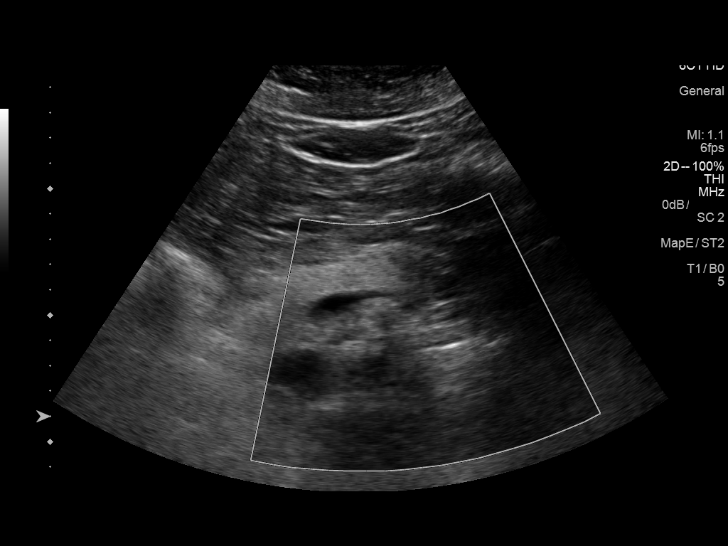

[14 of 25 positions shown; findings below may reference images not displayed]

FINDINGS: Gallbladder: No gallstones or wall thickening visualized. No
sonographic Murphy sign noted by sonographer.

Common bile duct: Diameter: 3.6 mm

Liver: Increased echogenicity consistent fatty infiltration or
hepatocellular disease. Portal vein is patent on color Doppler
imaging with normal direction of blood flow towards the liver.

IVC: No abnormality visualized.

Pancreas: Visualized portion unremarkable.

Spleen: Size and appearance within normal limits.

Right Kidney: Length: 9.8 cm. Echogenicity within normal limits. No
mass or hydronephrosis visualized.

Left Kidney: Length: 10.7 cm. Echogenicity within normal limits. No
mass or hydronephrosis visualized.

Abdominal aorta: No aneurysm visualized.

Other findings: None.
IMPRESSION: 1. No acute or focal abnormality. No gallstones or biliary
distention.

2. Increased hepatic echogenicity consistent with fatty infiltration
or hepatocellular disease.

## 2020-02-23 ENCOUNTER — Other Ambulatory Visit: Payer: Self-pay | Admitting: Physician Assistant

## 2020-02-23 ENCOUNTER — Encounter: Payer: Self-pay | Admitting: Physician Assistant

## 2020-02-24 MED ORDER — ALBUTEROL SULFATE HFA 108 (90 BASE) MCG/ACT IN AERS: 2.0000 | INHALATION_SPRAY | RESPIRATORY_TRACT | 2 refills | Status: DC | PRN

## 2020-08-18 ENCOUNTER — Encounter: Payer: Self-pay | Admitting: Emergency Medicine

## 2020-08-18 ENCOUNTER — Telehealth: Payer: BC Managed Care – PPO | Admitting: Emergency Medicine

## 2020-08-18 DIAGNOSIS — U071 COVID-19: Secondary | ICD-10-CM | POA: Diagnosis not present

## 2020-08-18 MED ORDER — QVAR REDIHALER 40 MCG/ACT IN AERB: 2.0000 | INHALATION_SPRAY | Freq: Two times a day (BID) | RESPIRATORY_TRACT | 0 refills | Status: DC

## 2020-08-18 NOTE — Progress Notes (Signed)
Mr. Alexander Morrison, Alexander Morrison are scheduled for a virtual visit with your provider today.    Just as we do with appointments in the office, we must obtain your consent to participate.  Your consent will be active for this visit and any virtual visit you may have with one of our providers in the next 365 days.    If you have a MyChart account, I can also send a copy of this consent to you electronically.  All virtual visits are billed to your insurance company just like a traditional visit in the office.  As this is a virtual visit, video technology does not allow for your provider to perform a traditional examination.  This may limit your provider's ability to fully assess your condition.  If your provider identifies any concerns that need to be evaluated in person or the need to arrange testing such as labs, EKG, etc, we will make arrangements to do so.    Although advances in technology are sophisticated, we cannot ensure that it will always work on either your end or our end.  If the connection with a video visit is poor, we may have to switch to a telephone visit.  With either a video or telephone visit, we are not always able to ensure that we have a secure connection.   I need to obtain your verbal consent now.   Are you willing to proceed with your visit today?   Alexander Morrison has provided verbal consent on 08/18/2020 for a virtual visit (video or telephone).   Alexander Horseman, PA-C 08/18/2020  1:50 PM     Virtual Visit via Video   I connected with patient on 08/18/20 at  2:00 PM EDT by a video enabled telemedicine application and verified that I am speaking with the correct person using two identifiers.  Location patient: Home Location provider: [facility] Persons participating in the virtual visit: Patient, Provider  I discussed the limitations of evaluation and management by telemedicine and the availability of in person appointments. The patient expressed understanding and  agreed to proceed.  Subjective:   HPI:   Patient presents via [facility] today with CC of COVID.  Has had SOB and cough. Has been having some headaches. Has had rhinorrhea.  Has had 1-2 episodes of diarrhea.  Denies fever, but hasn't checked with a thermometer.  Has been using an inhaler with relief.  Has also tried taking flonase and Tylenol.    ROS:   See pertinent positives and negatives per HPI.  Patient Active Problem List   Diagnosis Date Noted  . Snoring 10/23/2016  . Moderate persistent asthma without complication 10/21/2016  . Depression, recurrent (HCC) 10/21/2016  . Obesity 10/21/2016  . Allergy 10/21/2016    Social History   Tobacco Use  . Smoking status: Current Every Day Smoker    Types: Cigarettes    Last attempt to quit: 09/01/2012    Years since quitting: 7.9  . Smokeless tobacco: Never Used  Substance Use Topics  . Alcohol use: Yes    Alcohol/week: 2.0 standard drinks    Types: 2 Shots of liquor per week    Current Outpatient Medications:  .  acetaminophen (TYLENOL) 325 MG tablet, Take 650 mg by mouth every 6 (six) hours as needed., Disp: , Rfl:  .  albuterol (VENTOLIN HFA) 108 (90 Base) MCG/ACT inhaler, Inhale 2 puffs into the lungs every 4 (four) hours as needed for wheezing or shortness of breath., Disp: 8.5 g, Rfl: 2 .  beclomethasone (QVAR REDIHALER) 80 MCG/ACT inhaler, Inhale 2 puffs into the lungs daily., Disp: 10.6 g, Rfl: 2 .  calcium carbonate (TUMS - DOSED IN MG ELEMENTAL CALCIUM) 500 MG chewable tablet, Chew 1 tablet by mouth as needed for indigestion or heartburn., Disp: , Rfl:  .  cetirizine (ZYRTEC) 10 MG tablet, Take 10 mg by mouth daily., Disp: , Rfl:  .  esomeprazole (NEXIUM) 40 MG capsule, Take 1 capsule (40 mg total) by mouth daily. (Patient not taking: Reported on 11/03/2019), Disp: 60 capsule, Rfl: 0 .  fluticasone (FLONASE) 50 MCG/ACT nasal spray, Place 1 spray into both nostrils daily., Disp: , Rfl:   Allergies  Allergen Reactions   . Shellfish Allergy Anaphylaxis  . Iodine Swelling    Objective:   There were no vitals taken for this visit.  Patient is well-developed, well-nourished in no acute distress.  Resting comfortably at home.  Head is normocephalic, atraumatic.  No labored breathing.  Speech is clear and coherent with logical content.  Patient is alert and oriented at baseline.    Assessment and Plan:   Patient with recent COVID 19 diagnosis.  + home test.  Discussed treatment options.  Patient asked about infusions.  Discussed efficacy in most recent strains.  Discussed alternatives.   1. COVID-19  -Continue supportive care at home  -Albuterol 2 puffs q4hr  -Qvar 2 puffs daily.   Alexander Horseman, PA-C 08/18/2020

## 2020-08-18 NOTE — Patient Instructions (Signed)
  You have tested positive for COVID-19, meaning that you were infected with the novel coronavirus and could give the virus to others.  It is vitally important that you stay home so you do not spread it to others.      Please continue isolation at home, for at least 10 days since the start of your symptoms and until you have had 24 hours with no fever (without taking a fever reducer) and with improving of symptoms.  If you have no symptoms but tested positive (or all symptoms resolve after 5 days and you have no fever) you can leave your house but continue to wear a mask around others for an additional 5 days. If you have a fever,continue to stay home until you have had 24 hours of no fever. Most cases improve 5-10 days from onset but we have seen a small number of patients who have gotten worse after the 10 days.  Please be sure to watch for worsening symptoms and remain taking the proper precautions.   Go to the nearest hospital ED for assessment if fever/cough/breathlessness are severe or illness seems like a threat to life.    The following symptoms may appear 2-14 days after exposure: Fever Cough Shortness of breath or difficulty breathing Chills Repeated shaking with chills Muscle pain Headache Sore throat New loss of taste or smell Fatigue Congestion or runny nose Nausea or vomiting Diarrhea   You may also take acetaminophen (Tylenol) as needed for fever.  HOME CARE: Only take medications as instructed by your medical team. Drink plenty of fluids and get plenty of rest. A steam or ultrasonic humidifier can help if you have congestion.   GET HELP RIGHT AWAY IF YOU HAVE EMERGENCY WARNING SIGNS.  Call 911 or proceed to your closest emergency facility if: You develop worsening high fever. Trouble breathing Bluish lips or face Persistent pain or pressure in the chest New confusion Inability to wake or stay awake You cough up blood. Your symptoms become more severe Inability  to hold down food or fluids  This list is not all possible symptoms. Contact your medical provider for any symptoms that are severe or concerning to you.      

## 2021-02-12 ENCOUNTER — Other Ambulatory Visit: Payer: Self-pay | Admitting: Physician Assistant

## 2021-03-08 ENCOUNTER — Ambulatory Visit: Payer: BC Managed Care – PPO | Admitting: Physician Assistant

## 2021-05-08 ENCOUNTER — Other Ambulatory Visit: Payer: Self-pay | Admitting: Physician Assistant

## 2021-05-08 MED ORDER — ESOMEPRAZOLE MAGNESIUM 40 MG PO CPDR: 40.0000 mg | DELAYED_RELEASE_CAPSULE | Freq: Every day | ORAL | 0 refills | Status: DC

## 2021-05-09 NOTE — Progress Notes (Signed)
Subjective:    Alexander Morrison is a 36 y.o. male and is here for a comprehensive physical exam.  HPI  Health Maintenance Due  Topic Date Due   COVID-19 Vaccine (3 - Booster for Pfizer series) 10/27/2019   INFLUENZA VACCINE  12/04/2020    Acute Concerns: Elevated Blood Pressure Reading Upon seeing his weight gain and noticing his elevated blood pressure, Alexander Morrison expressed his concern. States that he was nervous prior to today's visit due to "letting himself go" and not being in the office for a while.  BP Readings from Last 3 Encounters:  05/10/21 (!) 142/90  11/03/19 122/80  05/14/19 126/80   Tobacco Use Currently Alexander Morrison is still smoking about half a pack of cigarettes a day. At this time he is not interested in stopping, but believes he will be ready in the future.   Suspected Sleep Apnea Pt states that his snoring has gotten to the point to where his wife is complaining about it. He is unsure if he stops breathing in the middle of the night, but is in agreement to be screened for sleep apnea.   Chronic Issues: Obesity Since our previous visit, Alexander Morrison has gained 17 pounds, but attributes most of that to the recent experience of a miscarriage with his wife. States at this time he does try to do light exercising throughout the day such as grabbing some dumbbells. He does find it hard to make it to the gym, due to work hours and not being a morning person. Although, he finds it hard to exercise, he does have the support of his wife to make healthy dietary choices.   GERD Pt is currently taking nexium 40 mg daily with no adverse effects. At this time he finds the medication to be helpful and is managing well.   Asthma Currently compliant with using albuterol 108 mcg inhaler every 4 hours as needed. States he does not have to use this often and is tolerating well.   Health Maintenance: Immunizations -- Covid- UTD; No booster Influenza- Due; 2020 Tdap- UTD;2018 Colonoscopy  -- N/A PSA -- N/A Ophthalmology- Not UTD Dentistry- UTD Diet -- Eats all foods  Caffeine Intake- Has increased due to lack of focus Sleep habits -- Normal sleep schedule Exercise -- Very light strength training occasionally Weight -- Increased, stable Weight history Wt Readings from Last 10 Encounters:  05/10/21 (!) 311 lb 6.1 oz (141.2 kg)  11/03/19 294 lb (133.4 kg)  05/14/19 296 lb (134.3 kg)  10/23/18 (!) 302 lb 6.1 oz (137.2 kg)  03/04/18 300 lb (136.1 kg)  02/17/18 (!) 300 lb 3.2 oz (136.2 kg)  01/13/18 (!) 300 lb 6.1 oz (136.3 kg)  10/21/16 278 lb 4 oz (126.2 kg)  09/20/13 290 lb (131.5 kg)  09/02/12 293 lb (132.9 kg)   Body mass index is 42.82 kg/m. Mood -- Stable Tobacco use -- About half a pack of cigarettes a day Tobacco Use: High Risk   Smoking Tobacco Use: Every Day   Smokeless Tobacco Use: Never   Passive Exposure: Not on file    Alcohol use ---  Occasionally binge drinks, about 5 drinks in one sitting when out with friends on the weekend Drug Use-- Participates in smoking marijuana often  Depression screen [facility] 2/9 05/10/2021  Decreased Interest 0  Down, Depressed, Hopeless 0  PHQ - 2 Score 0  Altered sleeping -  Tired, decreased energy -  Change in appetite -  Feeling bad or failure about  yourself  -  Trouble concentrating -  Moving slowly or fidgety/restless -  Suicidal thoughts -  PHQ-9 Score -  Difficult doing work/chores -     Other providers/specialists: Patient Care Team: Inda Coke, [place] as PCP - General (Physician Assistant)   PMHx, SurgHx, SocialHx, Medications, and Allergies were reviewed in the Visit Navigator and updated as appropriate.   Past Medical History:  Diagnosis Date   Allergy      Past Surgical History:  Procedure Laterality Date   APPENDECTOMY     HIP PINNING       Family History  Problem Relation Age of Onset   Hypertension Mother    Hypertension Brother    Lymphoma Brother    Hypertension Father     Alzheimer's disease Maternal Grandmother    Diabetes Other    CAD Other    Cancer Other    Stroke Other    Colon cancer Maternal Grandfather 63       late 60's   Hypertension Paternal Grandmother    Heart disease Paternal Grandmother    Diabetes Paternal Grandmother    Prostate cancer Neg Hx     Social History   Tobacco Use   Smoking status: Every Day    Types: Cigarettes    Last attempt to quit: 09/01/2012    Years since quitting: 8.6   Smokeless tobacco: Never  Vaping Use   Vaping Use: Every day  Substance Use Topics   Alcohol use: Yes    Alcohol/week: 2.0 standard drinks    Types: 2 Shots of liquor per week   Drug use: Yes    Frequency: 3.0 times per week    Types: Marijuana    Review of Systems:   Review of Systems  Constitutional:  Negative for chills, fever, malaise/fatigue and weight loss.  HENT:  Negative for hearing loss, sinus pain and sore throat.   Respiratory:  Negative for cough and hemoptysis.   Cardiovascular:  Negative for chest pain, palpitations, leg swelling and PND.  Gastrointestinal:  Negative for abdominal pain, constipation, diarrhea, heartburn, nausea and vomiting.  Genitourinary:  Negative for dysuria, frequency and urgency.  Musculoskeletal:  Negative for back pain, myalgias and neck pain.  Skin:  Negative for itching and rash.  Neurological:  Negative for dizziness, tingling, seizures and headaches.  Endo/Heme/Allergies:  Negative for polydipsia.  Psychiatric/Behavioral:  Negative for depression. The patient is not nervous/anxious.     Objective:   Vitals:   05/10/21 0807  BP: (!) 142/90  Pulse: 85  Temp: 98.4 F (36.9 C)  SpO2: 96%   Body mass index is 42.82 kg/m.  General Appearance:  Alert, cooperative, no distress, appears stated age  Head:  Normocephalic, without obvious abnormality, atraumatic  Eyes:  PERRL, conjunctiva/corneas clear, EOM's intact, fundi benign, both eyes       Ears:  Normal TM's and external ear canals,  both ears  Nose: Nares normal, septum midline, mucosa normal, no drainage    or sinus tenderness  Throat: Lips, mucosa, and tongue normal; teeth and gums normal  Neck: Supple, symmetrical, trachea midline, no adenopathy; thyroid:  No enlargement/tenderness/nodules; no carotit bruit or JVD  Back:   Symmetric, no curvature, ROM normal, no CVA tenderness  Lungs:   Clear to auscultation bilaterally, respirations unlabored  Chest wall:  No tenderness or deformity  Heart:  Regular rate and rhythm, S1 and S2 normal, no murmur, rub   or gallop  Abdomen:   Soft, non-tender, bowel sounds active all  four quadrants, no masses, no organomegaly  Extremities: Extremities normal, atraumatic, no cyanosis or edema  Prostate: Not done.   Skin: Skin color, texture, turgor normal, no rashes or lesions  Lymph nodes: Cervical, supraclavicular, and axillary nodes normal  Neurologic: CNII-XII grossly intact. Normal strength, sensation and reflexes throughout    Assessment/Plan:   Routine Physical Examination Today patient counseled on age appropriate routine health concerns for screening and prevention, each reviewed and up to date or declined. Immunizations reviewed and up to date or declined. Labs ordered and reviewed. Risk factors for depression reviewed and negative. Hearing function and visual acuity are intact. ADLs screened and addressed as needed. Functional ability and level of safety reviewed and appropriate. Education, counseling and referrals performed based on assessed risks today. Patient provided with a copy of personalized plan for preventive services.  Elevated Blood Pressure Reading Borderline elevated today Will not start medication at this time Monitor BP at home regularly, 1-2 times a week  Encouraged daily exercise and making healthier dietary choices Follow up in 1-3 months, sooner if concerns occur  Moderate persistent asthma without complication  Stable; no red flags on exam Continue  Albuterol 108 mcg inhaler, 2 puffs every fours hours as needed and Qvar Redihaler 40 mcg as needed  Gastroesophageal Reflux Disease, unspecified whether esophagitis present  Controlled Continue nexium 40 mg daily  Follow up if new or worsening symptoms occur  Tobacco Use Encouraged cessation, however patient is not ready to stop  Suspected sleep apnea Will refer to neurology for sleep study evaluation   Obesity, unspecified classification, unspecified obesity type, unspecified whether serious comorbidity present Encouraged patient to increase daily exercise and to participate in making healthy dietary choices  Will further discuss possibility of trialing a medication for weight loss in the future  Follow up in 1-3 months, sooner if concerns occur  Patient Counseling: [x]   Nutrition: Stressed importance of moderation in sodium/caffeine intake, saturated fat and cholesterol, caloric balance, sufficient intake of fresh fruits, vegetables, and fiber.  [x]   Stressed the importance of regular exercise.   []   Substance Abuse: Discussed cessation/primary prevention of tobacco, alcohol, or other drug use; driving or other dangerous activities under the influence; availability of treatment for abuse.   [x]   Injury prevention: Discussed safety belts, safety helmets, smoke detector, smoking near bedding or upholstery.   []   Sexuality: Discussed sexually transmitted diseases, partner selection, use of condoms, avoidance of unintended pregnancy  and contraceptive alternatives.   [x]   Dental health: Discussed importance of regular tooth brushing, flossing, and dental visits.  [x]   Health maintenance and immunizations reviewed. Please refer to Health maintenance section.    I,Havlyn C Ratchford,acting as a Education administrator for [employer], PA.,have documented all relevant documentation on the behalf of Inda Coke, PA,as directed by  Inda Coke, PA while in the presence of Inda Coke, [place].  I,  Inda Coke, [place], have reviewed all documentation for this visit. The documentation on 05/10/21 for the exam, diagnosis, procedures, and orders are all accurate and complete.   Inda Coke, PA-C [place]

## 2021-05-10 ENCOUNTER — Ambulatory Visit (INDEPENDENT_AMBULATORY_CARE_PROVIDER_SITE_OTHER): Payer: No Typology Code available for payment source | Admitting: Physician Assistant

## 2021-05-10 ENCOUNTER — Encounter: Payer: Self-pay | Admitting: Physician Assistant

## 2021-05-10 ENCOUNTER — Other Ambulatory Visit: Payer: Self-pay

## 2021-05-10 ENCOUNTER — Other Ambulatory Visit: Payer: Self-pay | Admitting: Physician Assistant

## 2021-05-10 VITALS — BP 142/90 | HR 85 | Temp 98.4°F | Ht 71.5 in | Wt 311.4 lb

## 2021-05-10 DIAGNOSIS — Z720 Tobacco use: Secondary | ICD-10-CM

## 2021-05-10 DIAGNOSIS — R29818 Other symptoms and signs involving the nervous system: Secondary | ICD-10-CM

## 2021-05-10 DIAGNOSIS — E669 Obesity, unspecified: Secondary | ICD-10-CM

## 2021-05-10 DIAGNOSIS — J4540 Moderate persistent asthma, uncomplicated: Secondary | ICD-10-CM

## 2021-05-10 DIAGNOSIS — Z0000 Encounter for general adult medical examination without abnormal findings: Secondary | ICD-10-CM

## 2021-05-10 DIAGNOSIS — K219 Gastro-esophageal reflux disease without esophagitis: Secondary | ICD-10-CM

## 2021-05-10 LAB — LIPID PANEL
Cholesterol: 156 mg/dL (ref 0–200)
HDL: 49.1 mg/dL (ref >39.00)
LDL Cholesterol: 70 mg/dL (ref 0–99)
NonHDL: 106.95
Total CHOL/HDL Ratio: 3
Triglycerides: 185 mg/dL — ABNORMAL HIGH (ref 0.0–149.0)
VLDL: 37 mg/dL (ref 0.0–40.0)

## 2021-05-10 LAB — CBC WITH DIFFERENTIAL/PLATELET
Basophils Absolute: 0.1 10*3/uL (ref 0.0–0.1)
Basophils Relative: 1.2 % (ref 0.0–3.0)
Eosinophils Absolute: 0.1 10*3/uL (ref 0.0–0.7)
Eosinophils Relative: 2.1 % (ref 0.0–5.0)
HCT: 45.8 % (ref 39.0–52.0)
Hemoglobin: 14.9 g/dL (ref 13.0–17.0)
Lymphocytes Relative: 39.9 % (ref 12.0–46.0)
Lymphs Abs: 2.3 10*3/uL (ref 0.7–4.0)
MCHC: 32.6 g/dL (ref 30.0–36.0)
MCV: 88.5 fl (ref 78.0–100.0)
Monocytes Absolute: 0.5 10*3/uL (ref 0.1–1.0)
Monocytes Relative: 9.4 % (ref 3.0–12.0)
Neutro Abs: 2.7 10*3/uL (ref 1.4–7.7)
Neutrophils Relative %: 47.4 % (ref 43.0–77.0)
Platelets: 256 10*3/uL (ref 150.0–400.0)
RBC: 5.17 Mil/uL (ref 4.22–5.81)
RDW: 13.2 % (ref 11.5–15.5)
WBC: 5.7 10*3/uL (ref 4.0–10.5)

## 2021-05-10 LAB — COMPREHENSIVE METABOLIC PANEL
ALT: 41 U/L (ref 0–53)
AST: 29 U/L (ref 0–37)
Albumin: 4.1 g/dL (ref 3.5–5.2)
Alkaline Phosphatase: 72 U/L (ref 39–117)
BUN: 13 mg/dL (ref 6–23)
CO2: 27 mEq/L (ref 19–32)
Calcium: 9.4 mg/dL (ref 8.4–10.5)
Chloride: 102 mEq/L (ref 96–112)
Creatinine, Ser: 1.1 mg/dL (ref 0.40–1.50)
GFR: 88.17 mL/min (ref >60.00)
Glucose, Bld: 92 mg/dL (ref 70–99)
Potassium: 4.2 mEq/L (ref 3.5–5.1)
Sodium: 137 mEq/L (ref 135–145)
Total Bilirubin: 0.5 mg/dL (ref 0.2–1.2)
Total Protein: 7.6 g/dL (ref 6.0–8.3)

## 2021-05-10 LAB — HEMOGLOBIN A1C: Hgb A1c MFr Bld: 5.9 % (ref 4.6–6.5)

## 2021-05-10 MED ORDER — QVAR REDIHALER 40 MCG/ACT IN AERB: 2.0000 | INHALATION_SPRAY | Freq: Two times a day (BID) | RESPIRATORY_TRACT | 3 refills | Status: DC

## 2021-05-10 MED ORDER — ESOMEPRAZOLE MAGNESIUM 40 MG PO CPDR: 40.0000 mg | DELAYED_RELEASE_CAPSULE | Freq: Every day | ORAL | 3 refills | Status: DC

## 2021-05-10 NOTE — Patient Instructions (Addendum)
It was great to see you!  Let's follow-up in 1-3 months to check on your blood pressure and weight. We can discuss medications for weight loss at that time.  Please go to the lab for blood work.   Our office will call you with your results unless you have chosen to receive results via MyChart.  If your blood work is normal we will follow-up each year for physicals and as scheduled for chronic medical problems.  If anything is abnormal we will treat accordingly and get you in for a follow-up.  Take care,  Lelon Mast

## 2021-05-31 IMAGING — DX LEFT FOOT - COMPLETE 3+ VIEW
3 series · 3 of 3 positions shown · non-contrast
Comparison: None.

CLINICAL DATA: Calcaneal pain, 6 months duration.

EXAM:
LEFT FOOT - COMPLETE 3+ VIEW

[foot dp]
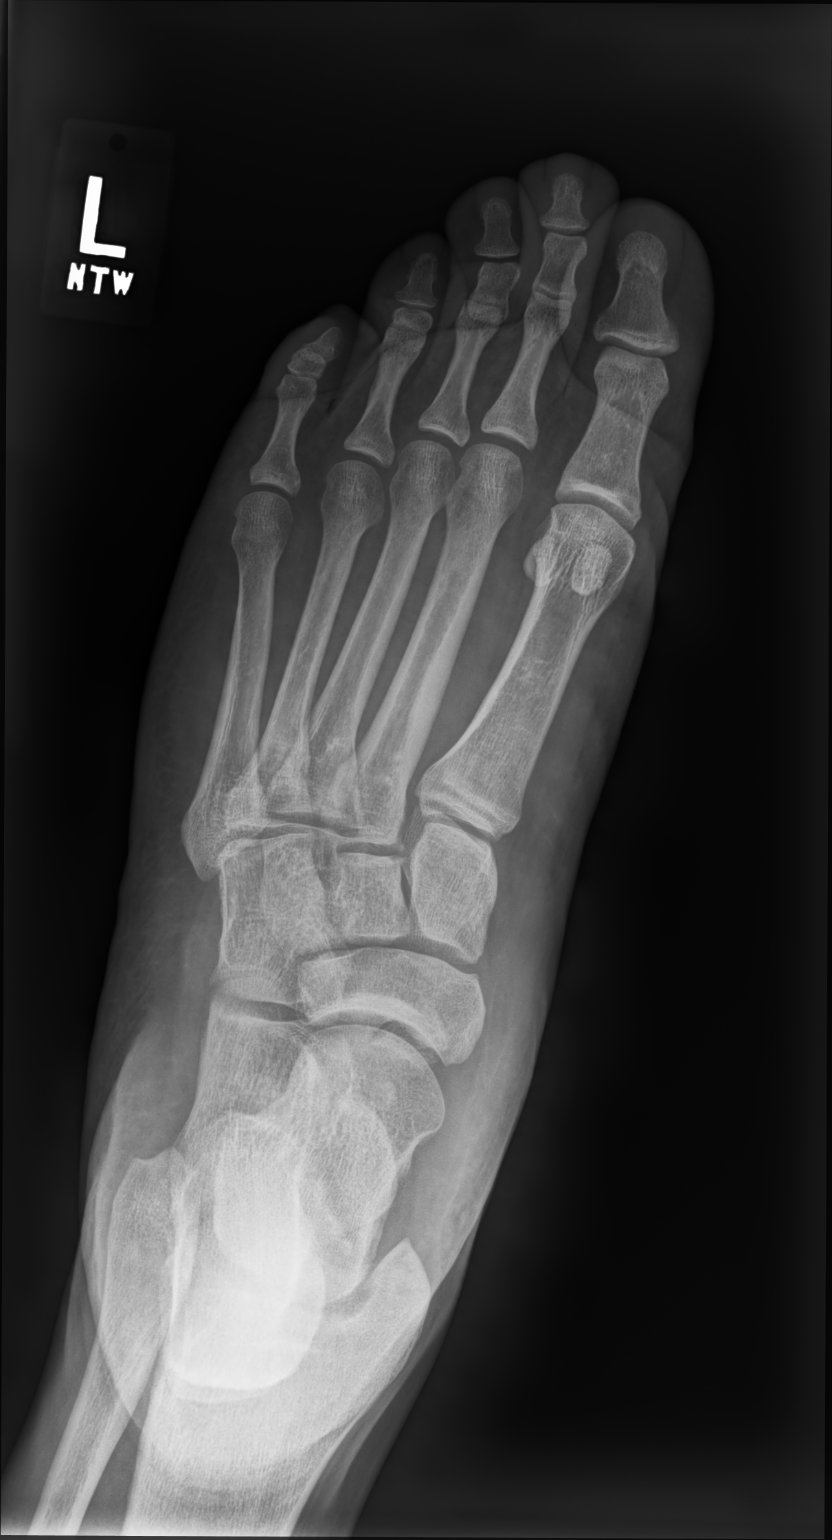

[foot oblique]
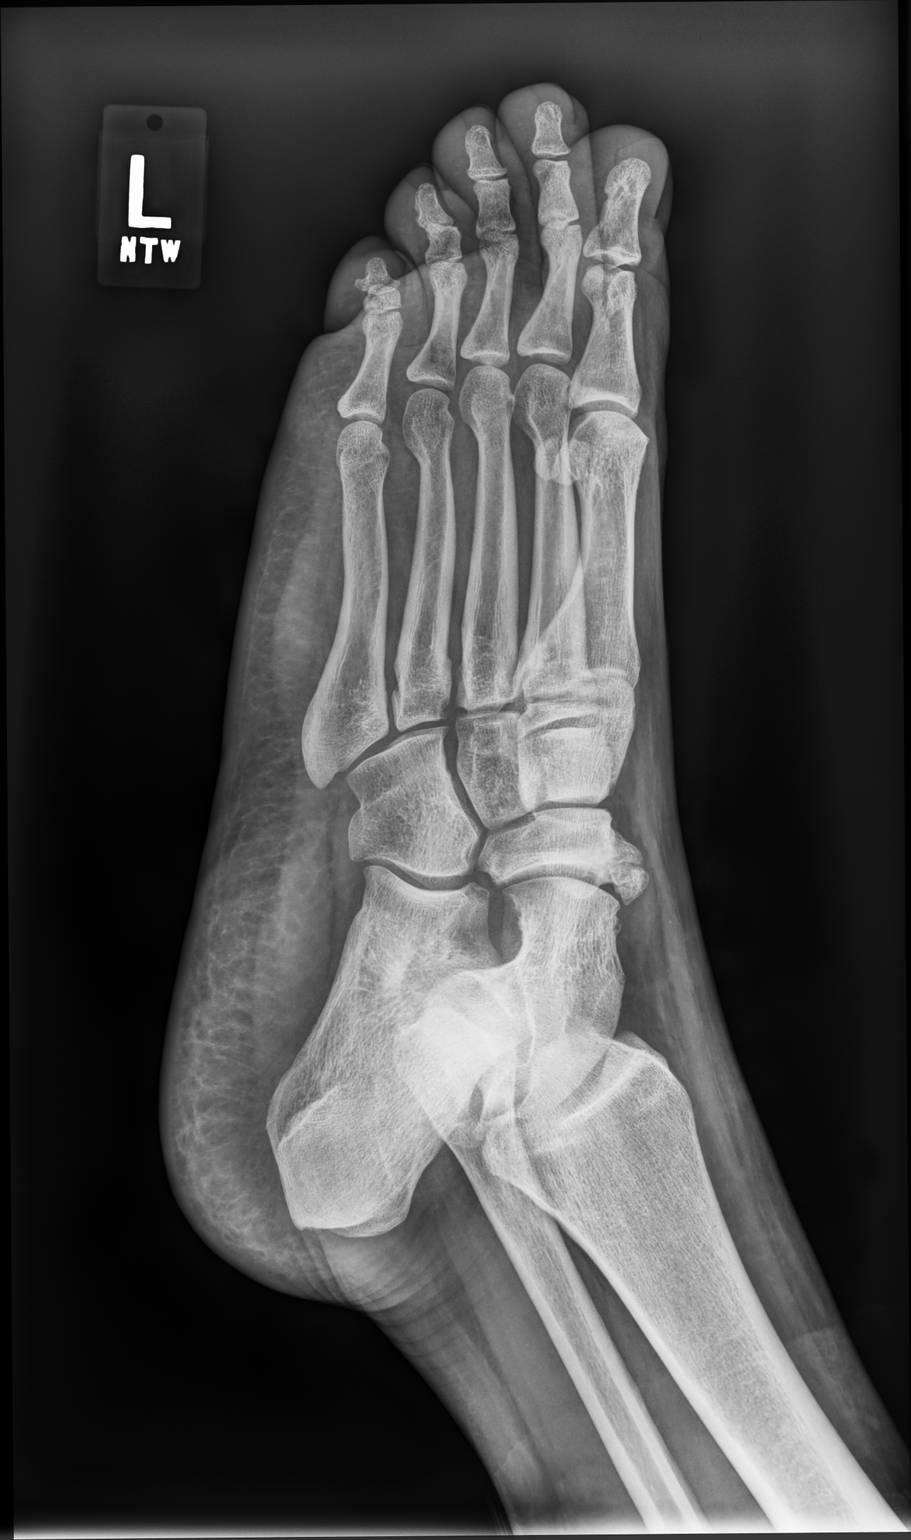

[foot lat]
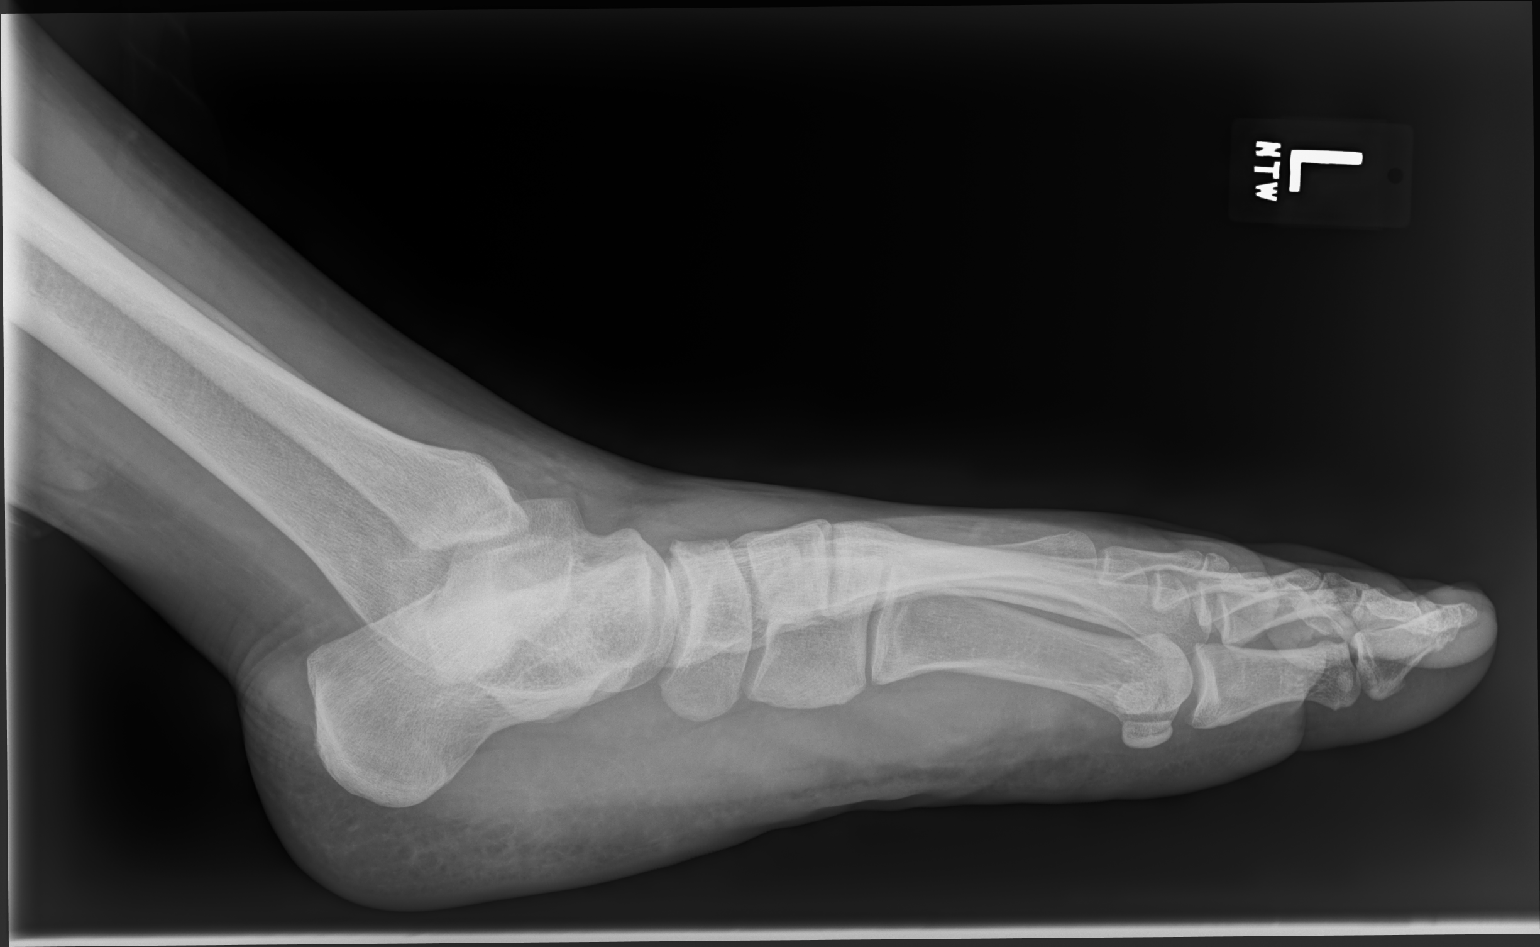

[3 of 3 positions shown; findings below may reference images not displayed]

FINDINGS: Pes planus. No evidence of fracture. No calcaneal abnormality. No
evidence of arthropathy. Irregularity of the dorsal corner of the
navicular which could be due to old ligamentous injury or stress.
IMPRESSION: No calcaneal abnormality seen.  Pes planus.

## 2021-07-19 ENCOUNTER — Institutional Professional Consult (permissible substitution): Payer: Self-pay | Admitting: Neurology

## 2021-08-22 ENCOUNTER — Other Ambulatory Visit: Payer: Self-pay | Admitting: Physician Assistant

## 2021-08-22 MED ORDER — FLUTICASONE PROPIONATE HFA 44 MCG/ACT IN AERO: 1.0000 | INHALATION_SPRAY | Freq: Two times a day (BID) | RESPIRATORY_TRACT | 12 refills | Status: DC

## 2021-08-22 NOTE — Telephone Encounter (Signed)
Please see message from pharmacy. Qvar not on pt's formulary. ?

## 2021-08-27 ENCOUNTER — Encounter: Payer: Self-pay | Admitting: Neurology

## 2021-08-27 ENCOUNTER — Institutional Professional Consult (permissible substitution): Payer: Self-pay | Admitting: Neurology

## 2021-10-10 ENCOUNTER — Encounter: Payer: Self-pay | Admitting: Physician Assistant

## 2021-10-10 ENCOUNTER — Ambulatory Visit (INDEPENDENT_AMBULATORY_CARE_PROVIDER_SITE_OTHER): Payer: No Typology Code available for payment source | Admitting: Physician Assistant

## 2021-10-10 VITALS — BP 140/86 | HR 72 | Temp 98.3°F | Ht 71.5 in | Wt 300.4 lb

## 2021-10-10 DIAGNOSIS — Z720 Tobacco use: Secondary | ICD-10-CM

## 2021-10-10 DIAGNOSIS — E8881 Metabolic syndrome: Secondary | ICD-10-CM | POA: Diagnosis not present

## 2021-10-10 DIAGNOSIS — R002 Palpitations: Secondary | ICD-10-CM

## 2021-10-10 DIAGNOSIS — R030 Elevated blood-pressure reading, without diagnosis of hypertension: Secondary | ICD-10-CM | POA: Diagnosis not present

## 2021-10-10 DIAGNOSIS — F339 Major depressive disorder, recurrent, unspecified: Secondary | ICD-10-CM

## 2021-10-10 DIAGNOSIS — R29818 Other symptoms and signs involving the nervous system: Secondary | ICD-10-CM

## 2021-10-10 LAB — COMPREHENSIVE METABOLIC PANEL
ALT: 43 U/L (ref 0–53)
AST: 31 U/L (ref 0–37)
Albumin: 4.4 g/dL (ref 3.5–5.2)
Alkaline Phosphatase: 76 U/L (ref 39–117)
BUN: 19 mg/dL (ref 6–23)
CO2: 27 mEq/L (ref 19–32)
Calcium: 9.9 mg/dL (ref 8.4–10.5)
Chloride: 102 mEq/L (ref 96–112)
Creatinine, Ser: 1.05 mg/dL (ref 0.40–1.50)
GFR: 92.96 mL/min (ref >60.00)
Glucose, Bld: 87 mg/dL (ref 70–99)
Potassium: 4.3 mEq/L (ref 3.5–5.1)
Sodium: 136 mEq/L (ref 135–145)
Total Bilirubin: 0.4 mg/dL (ref 0.2–1.2)
Total Protein: 7.7 g/dL (ref 6.0–8.3)

## 2021-10-10 LAB — CBC WITH DIFFERENTIAL/PLATELET
Basophils Absolute: 0 10*3/uL (ref 0.0–0.1)
Basophils Relative: 1 % (ref 0.0–3.0)
Eosinophils Absolute: 0.1 10*3/uL (ref 0.0–0.7)
Eosinophils Relative: 2.9 % (ref 0.0–5.0)
HCT: 43.7 % (ref 39.0–52.0)
Hemoglobin: 14.1 g/dL (ref 13.0–17.0)
Lymphocytes Relative: 37.8 % (ref 12.0–46.0)
Lymphs Abs: 1.7 10*3/uL (ref 0.7–4.0)
MCHC: 32.3 g/dL (ref 30.0–36.0)
MCV: 88.6 fl (ref 78.0–100.0)
Monocytes Absolute: 0.4 10*3/uL (ref 0.1–1.0)
Monocytes Relative: 8.3 % (ref 3.0–12.0)
Neutro Abs: 2.2 10*3/uL (ref 1.4–7.7)
Neutrophils Relative %: 50 % (ref 43.0–77.0)
Platelets: 268 10*3/uL (ref 150.0–400.0)
RBC: 4.93 Mil/uL (ref 4.22–5.81)
RDW: 13.7 % (ref 11.5–15.5)
WBC: 4.4 10*3/uL (ref 4.0–10.5)

## 2021-10-10 LAB — HEMOGLOBIN A1C: Hgb A1c MFr Bld: 5.9 % (ref 4.6–6.5)

## 2021-10-10 LAB — TSH: TSH: 3.45 u[IU]/mL (ref 0.35–5.50)

## 2021-10-10 MED ORDER — CITALOPRAM HYDROBROMIDE 10 MG PO TABS: 10.0000 mg | ORAL_TABLET | Freq: Every day | ORAL | 1 refills | Status: DC

## 2021-10-10 NOTE — Patient Instructions (Addendum)
It was great to see you!  *Please schedule your sleep study --> [phone] *Work on better diet *Work on better sleep *Work on alcohol and tobacco reduction  Keep a track of your blood pressures for [place] -- take just a few times a week. Also, bring your blood pressure monitor with you at follow-up so we can compare to our machines.  Start celexa 10 mg daily  Let's follow-up in 4-6 weeks, sooner if you have concerns.  Take care,  Jarold Motto PA-C

## 2021-10-10 NOTE — Progress Notes (Signed)
Alexander FujitaDrew S Manger is a 36 y.o. male here for a follow up of a pre-existing problem.  History of Present Illness:   Chief Complaint  Patient presents with   c/o elevated blood pressure    Pt c/o elevated Bp checking at home, yesterday was 146/104.   Palpitations    Pt c/o palpitations past 3 weeks off and on.    HPI  Heart Palpitations  Patient complain of heart palpitation that has been present for the past 3 weeks. Symptoms comes and goes. States he has been experiencing palpitation when he is at rest. Reports he get this every once every 2 week. Usually lasted for 2-5 minutes. Has had noticed some shortness of breath but he thinks this id due to his stress/anxiety while having symptoms. Denies excessive caffeine intake, fever, chest pain, LE swelling.   Elevated Blood Pressure Readings Patient here to follow-up. Currently taking no medication. He has been checking his blood pressure at home. States he checked this due to experiencing headaches and heart palpitations. At home blood pressure readings were: 146/104 yesterday. He thinks this might be related to be stress or life changes. He states he [place] to [place]. Has had few drinks-last drink was Sunday. States he drinks 2 cups of coffee a day. Patient denies chest pain, SOB, blurred vision, dizziness, unusual headaches, lower leg swelling. Denies stimulant usage, excessive alcohol intake, or increase in salt consumption.  BP Readings from Last 3 Encounters:  10/10/21 140/86  05/10/21 (!) 142/90  11/03/19 122/80    Depression  Patient complain of increased stress for the past few months. States he and his wife are expecting their first baby girl in September. He was taking Zoloft 37.5 mg daily in the past. States he is interested in starting the medication at this time. His symptoms seems to be not improving. Denies SI/HI.   Insulin Resistance Patient has been struggling to lose weight for the past few months. He states he has  not following a healthy diet. He has been drinking coffee about 2 cups a day. He has been struggling to follow a healthy diet. He has been under a lot of stress recently. He has been working around his house to move stuff. Denies any other concerning sx.   Suspected Sleep Apnea  Patient states he has been struggling to sleep at night. He has been under a lot stress recently. He has tried sleep aid but this makes him feel "groggy". He and his wife are expected a baby. We referred him to neurology in th last visit. He states he has to schedule appointment  and he has not done this.   Tobacco Abuse Continues to vape the equivalent of 1/2 pack of cigarettes per day.  Past Medical History:  Diagnosis Date   Allergy      Social History   Tobacco Use   Smoking status: Every Day    Packs/day: 0.50    Types: Cigarettes    Last attempt to quit: 09/01/2012    Years since quitting: 9.1   Smokeless tobacco: Never  Vaping Use   Vaping Use: Every day  Substance Use Topics   Alcohol use: Yes    Alcohol/week: 2.0 standard drinks    Types: 2 Shots of liquor per week    Comment: weekend bingeing   Drug use: Yes    Frequency: 3.0 times per week    Types: Marijuana    Past Surgical History:  Procedure Laterality Date  APPENDECTOMY     HIP PINNING      Family History  Problem Relation Age of Onset   Hypertension Mother    Hypertension Brother    Lymphoma Brother    Alzheimer's disease Maternal Grandmother    Colon cancer Maternal Grandfather 19       late 60's   Hypertension Paternal Grandmother    Heart disease Paternal Grandmother    Diabetes Paternal Grandmother    Diabetes Other    CAD Other    Cancer Other    Stroke Other    Prostate cancer Neg Hx     Allergies  Allergen Reactions   Shellfish Allergy Anaphylaxis   Iodine Swelling    Current Medications:   Current Outpatient Medications:    acetaminophen (TYLENOL) 325 MG tablet, Take 650 mg by mouth every 6 (six)  hours as needed., Disp: , Rfl:    albuterol (VENTOLIN HFA) 108 (90 Base) MCG/ACT inhaler, INHALE 2 PUFFS INTO THE LUNGS EVERY 4 HOURS AS NEEDED FOR WHEEZE OR FOR SHORTNESS OF BREATH, Disp: 8.5 each, Rfl: 0   calcium carbonate (TUMS - DOSED IN MG ELEMENTAL CALCIUM) 500 MG chewable tablet, Chew 1 tablet by mouth as needed for indigestion or heartburn., Disp: , Rfl:    cetirizine (ZYRTEC) 10 MG tablet, Take 10 mg by mouth daily., Disp: , Rfl:    esomeprazole (NEXIUM) 40 MG capsule, Take 1 capsule (40 mg total) by mouth daily., Disp: 90 capsule, Rfl: 3   fluticasone (FLONASE) 50 MCG/ACT nasal spray, Place 1 spray into both nostrils daily., Disp: , Rfl:    fluticasone (FLOVENT HFA) 44 MCG/ACT inhaler, Inhale 1 puff into the lungs 2 (two) times daily., Disp: 1 each, Rfl: 12   Review of Systems:   ROS Negative unless otherwise specified per HPI.   Vitals:   Vitals:   10/10/21 0934  BP: 140/86  Pulse: 72  Temp: 98.3 F (36.8 C)  TempSrc: Temporal  SpO2: 95%  Weight: (!) 300 lb 6.1 oz (136.3 kg)  Height: 5' 11.5" (1.816 m)     Body mass index is 41.31 kg/m.  Physical Exam:   Physical Exam Vitals and nursing note reviewed.  Constitutional:      General: He is not in acute distress.    Appearance: He is well-developed. He is not ill-appearing or toxic-appearing.  Cardiovascular:     Rate and Rhythm: Normal rate and regular rhythm.     Pulses: Normal pulses.     Heart sounds: Normal heart sounds, S1 normal and S2 normal.  Pulmonary:     Effort: Pulmonary effort is normal.     Breath sounds: Normal breath sounds.  Skin:    General: Skin is warm and dry.  Neurological:     Mental Status: He is alert.     GCS: GCS eye subscore is 4. GCS verbal subscore is 5. GCS motor subscore is 6.  Psychiatric:        Speech: Speech normal.        Behavior: Behavior normal. Behavior is cooperative.    Assessment and Plan:   Palpitations EKG tracing is personally reviewed.  EKG notes NSR.   No acute changes.  No red flags on discussion Suspect possible PVC/PAC's Symptoms are too infrequent for monitor at this point Recommend working on: alcohol reduction, stress reduction, better diet  Elevated blood pressure reading Overall normotensive today Recommend checking BP at home 2-3 x per week and bringing log and cuff at next visit If  remains consistently elevated, will start medication  Tobacco abuse Counseled  Insulin resistance Update A1c and provide recommendations accordingly  Suspected sleep apnea Provided phone number for [facility], recommend he call to schedule appointment for evaluation and management of this  Depression, recurrent (HCC) Ongoing Start celexa 10 mg daily Follow-up in 1 month, sooner if concerns I discussed with patient that if they develop any SI, to tell someone immediately and seek medical attention.  I,Savera Zaman,acting as a Neurosurgeon for [employer], PA.,have documented all relevant documentation on the behalf of Jarold Motto, PA,as directed by  Jarold Motto, PA while in the presence of Jarold Motto, [place].   I, Jarold Motto, [place], have reviewed all documentation for this visit. The documentation on 10/10/21 for the exam, diagnosis, procedures, and orders are all accurate and complete.  Jarold Motto, PA-C

## 2021-11-07 ENCOUNTER — Encounter: Payer: Self-pay | Admitting: Physician Assistant

## 2021-11-07 ENCOUNTER — Ambulatory Visit: Payer: No Typology Code available for payment source | Admitting: Physician Assistant

## 2021-11-07 ENCOUNTER — Ambulatory Visit (INDEPENDENT_AMBULATORY_CARE_PROVIDER_SITE_OTHER): Payer: No Typology Code available for payment source | Admitting: Physician Assistant

## 2021-11-07 VITALS — BP 122/80 | HR 84 | Temp 97.4°F | Ht 71.5 in | Wt 296.5 lb

## 2021-11-07 DIAGNOSIS — R29818 Other symptoms and signs involving the nervous system: Secondary | ICD-10-CM

## 2021-11-07 DIAGNOSIS — R030 Elevated blood-pressure reading, without diagnosis of hypertension: Secondary | ICD-10-CM | POA: Diagnosis not present

## 2021-11-07 DIAGNOSIS — F339 Major depressive disorder, recurrent, unspecified: Secondary | ICD-10-CM | POA: Diagnosis not present

## 2021-11-07 NOTE — Progress Notes (Incomplete)
Alexander Morrison is a 36 y.o. male here for a follow up of a pre-existing problem.  SCRIBE STATEMENT  History of Present Illness:   No chief complaint on file.   HPI Anxiety/Depression Patient is currently taking Celexa 10 mg daily.  Past Medical History:  Diagnosis Date   Allergy      Social History   Tobacco Use   Smoking status: Every Day    Packs/day: 0.50    Types: Cigarettes    Last attempt to quit: 09/01/2012    Years since quitting: 9.1   Smokeless tobacco: Never  Vaping Use   Vaping Use: Every day  Substance Use Topics   Alcohol use: Yes    Alcohol/week: 2.0 standard drinks of alcohol    Types: 2 Shots of liquor per week    Comment: weekend bingeing   Drug use: Yes    Frequency: 3.0 times per week    Types: Marijuana    Past Surgical History:  Procedure Laterality Date   APPENDECTOMY     HIP PINNING      Family History  Problem Relation Age of Onset   Hypertension Mother    Hypertension Father    Hypertension Brother    Lymphoma Brother    Alzheimer's disease Maternal Grandmother    Colon cancer Maternal Grandfather 2       late 60's   Hypertension Paternal Grandmother    Heart disease Paternal Grandmother    Diabetes Paternal Grandmother    Diabetes Other    CAD Other    Cancer Other    Stroke Other    Prostate cancer Neg Hx     Allergies  Allergen Reactions   Shellfish Allergy Anaphylaxis   Iodine Swelling    Current Medications:   Current Outpatient Medications:    acetaminophen (TYLENOL) 325 MG tablet, Take 650 mg by mouth every 6 (six) hours as needed., Disp: , Rfl:    albuterol (VENTOLIN HFA) 108 (90 Base) MCG/ACT inhaler, INHALE 2 PUFFS INTO THE LUNGS EVERY 4 HOURS AS NEEDED FOR WHEEZE OR FOR SHORTNESS OF BREATH, Disp: 8.5 each, Rfl: 0   calcium carbonate (TUMS - DOSED IN MG ELEMENTAL CALCIUM) 500 MG chewable tablet, Chew 1 tablet by mouth as needed for indigestion or heartburn., Disp: , Rfl:    cetirizine (ZYRTEC) 10 MG  tablet, Take 10 mg by mouth daily., Disp: , Rfl:    citalopram (CELEXA) 10 MG tablet, Take 1 tablet (10 mg total) by mouth daily., Disp: 30 tablet, Rfl: 1   esomeprazole (NEXIUM) 40 MG capsule, Take 1 capsule (40 mg total) by mouth daily., Disp: 90 capsule, Rfl: 3   fluticasone (FLONASE) 50 MCG/ACT nasal spray, Place 1 spray into both nostrils daily., Disp: , Rfl:    fluticasone (FLOVENT HFA) 44 MCG/ACT inhaler, Inhale 1 puff into the lungs 2 (two) times daily., Disp: 1 each, Rfl: 12   Review of Systems:   ROS Negative unless otherwise specified per HPI.   Vitals:   There were no vitals filed for this visit.   There is no height or weight on file to calculate BMI.  Physical Exam:   Physical Exam  Assessment and Plan:   @DIAGLIST @    I,Savera Zaman,acting as a scribe for , PA.,have documented all relevant documentation on the behalf of [employer], PA,as directed by  Jarold Motto, PA while in the presence of Jarold Motto, Jarold Motto.   ***  [place], PA-C

## 2021-11-07 NOTE — Progress Notes (Signed)
Alexander Morrison is a 36 y.o. male here for a follow up of a pre-existing problem.  History of Present Illness:   Chief Complaint  Patient presents with   Depression   f/u elevated blood pressure    Pt has not checked blood pressure in awhile, average was systolic 135-142, diastolic 85-90.    HPI  Anxiety/Depression Patient has had increased situational anxiety in the past. This is mostly related to his family. States he has not started taking Celexa 10 mg daily. He was prescribed this in our previous visit due to worsening symptoms. His symptoms are well managed at this time. Denies any worsening anxiety symptoms or other concerning sx. No SI/HI.   Elevated BP Readings Currently taking no medication. At home blood pressure readings are: 133/80. States he has decreased his alcohol intake. Patient denies chest pain, SOB, blurred vision, dizziness, unusual headaches, lower leg swelling. Denies excessive caffeine intake, stimulant usage, excessive alcohol intake, or increase in salt consumption.  BP Readings from Last 3 Encounters:  11/07/21 122/80  10/10/21 140/86  05/10/21 (!) 142/90    Suspected Sleep Apnea  Patient states he has been having issue with sleeping. Per pt, he tries to go to sleep around 1 am at night but does not fall sleep until 2-3 am. Does snore at night. Has not scheduled appointment for sleep study. Denies any other concerning sx.   Past Medical History:  Diagnosis Date   Allergy      Social History   Tobacco Use   Smoking status: Former    Packs/day: 0.50    Types: Cigarettes    Quit date: 09/01/2012    Years since quitting: 9.1   Smokeless tobacco: Never  Vaping Use   Vaping Use: Every day  Substance Use Topics   Alcohol use: Yes    Alcohol/week: 2.0 standard drinks of alcohol    Types: 2 Shots of liquor per week    Comment: weekend bingeing   Drug use: Yes    Frequency: 3.0 times per week    Types: Marijuana    Past Surgical History:   Procedure Laterality Date   APPENDECTOMY     HIP PINNING      Family History  Problem Relation Age of Onset   Hypertension Mother    Hypertension Father    Hypertension Brother    Lymphoma Brother    Alzheimer's disease Maternal Grandmother    Colon cancer Maternal Grandfather 49       late 60's   Hypertension Paternal Grandmother    Heart disease Paternal Grandmother    Diabetes Paternal Grandmother    Diabetes Other    CAD Other    Cancer Other    Stroke Other    Prostate cancer Neg Hx     Allergies  Allergen Reactions   Shellfish Allergy Anaphylaxis   Iodine Swelling    Current Medications:   Current Outpatient Medications:    acetaminophen (TYLENOL) 325 MG tablet, Take 650 mg by mouth every 6 (six) hours as needed., Disp: , Rfl:    albuterol (VENTOLIN HFA) 108 (90 Base) MCG/ACT inhaler, INHALE 2 PUFFS INTO THE LUNGS EVERY 4 HOURS AS NEEDED FOR WHEEZE OR FOR SHORTNESS OF BREATH, Disp: 8.5 each, Rfl: 0   calcium carbonate (TUMS - DOSED IN MG ELEMENTAL CALCIUM) 500 MG chewable tablet, Chew 1 tablet by mouth as needed for indigestion or heartburn., Disp: , Rfl:    cetirizine (ZYRTEC) 10 MG tablet, Take 10 mg by mouth  daily., Disp: , Rfl:    citalopram (CELEXA) 10 MG tablet, Take 1 tablet (10 mg total) by mouth daily., Disp: 30 tablet, Rfl: 1   esomeprazole (NEXIUM) 40 MG capsule, Take 1 capsule (40 mg total) by mouth daily., Disp: 90 capsule, Rfl: 3   fluticasone (FLONASE) 50 MCG/ACT nasal spray, Place 1 spray into both nostrils daily., Disp: , Rfl:    fluticasone (FLOVENT HFA) 44 MCG/ACT inhaler, Inhale 1 puff into the lungs 2 (two) times daily., Disp: 1 each, Rfl: 12   Review of Systems:   ROS Negative unless otherwise specified per HPI.  Vitals:   Vitals:   11/07/21 1124  BP: 122/80  Pulse: 84  Temp: (!) 97.4 F (36.3 C)  TempSrc: Temporal  SpO2: 96%  Weight: 296 lb 8 oz (134.5 kg)  Height: 5' 11.5" (1.816 m)     Body mass index is 40.78  kg/m.  Physical Exam:   Physical Exam Vitals and nursing note reviewed.  Constitutional:      General: He is not in acute distress.    Appearance: He is well-developed. He is not ill-appearing or toxic-appearing.  Cardiovascular:     Rate and Rhythm: Normal rate and regular rhythm.     Pulses: Normal pulses.     Heart sounds: Normal heart sounds, S1 normal and S2 normal.  Pulmonary:     Effort: Pulmonary effort is normal.     Breath sounds: Normal breath sounds.  Skin:    General: Skin is warm and dry.  Neurological:     Mental Status: He is alert.     GCS: GCS eye subscore is 4. GCS verbal subscore is 5. GCS motor subscore is 6.  Psychiatric:        Speech: Speech normal.        Behavior: Behavior normal. Behavior is cooperative.     Assessment and Plan:   Elevated blood pressure reading Normotensive in office Follow-up in 6 months, sooner if concerns Continue to monitor BP at home, if consistently > 130/90, he was told to reach out  Depression, recurrent (HCC) Well controlled per patient He has celexa 10 mg to start if needed I discussed with patient that if they develop any SI, to tell someone immediately and seek medical attention. Follow-up in 6 months, sooner if concerns  Suspected sleep apnea Encouraged patient to reach out to schedule appt for sleep study   I,Savera Zaman,acting as a scribe for [employer], PA.,have documented all relevant documentation on the behalf of Jarold Motto, PA,as directed by  Jarold Motto, PA while in the presence of Jarold Motto, [place].   I, Jarold Motto, [place], have reviewed all documentation for this visit. The documentation on 11/07/21 for the exam, diagnosis, procedures, and orders are all accurate and complete.   Jarold Motto, PA-C

## 2021-11-07 NOTE — Patient Instructions (Signed)
It was great to see you!  Sleep Hygiene  Do: (1) Go to bed at the same time each day. (2) Get up from bed at the same time each day. (3) Get regular exercise each day, preferably in the morning.  There is goof evidence that regular exercise improves restful sleep.  This includes stretching and aerobic exercise. (4) Get regular exposure to outdoor or bright lights, especially in the late afternoon. (5) Keep the temperature in your bedroom comfortable. (6) Keep the bedroom quiet when sleeping. (7) Keep the bedroom dark enough to facilitate sleep. (8) Use your bed only for sleep and sex. (9) Take medications as directed.  It is helpful to take prescribed sleeping pills 1 hour before bedtime, so they are causing drowsiness when you lie down, or 10 hours before getting up, to avoid daytime drowsiness. (10) Use a relaxation exercise just before going to sleep -- imagery, massage, warm bath. (11) Keep your feet and hands warm.  Wear warm socks and/or mittens or gloves to bed.  Don't: (1) Exercise just before going to bed. (2) Engage in stimulating activity just before bed, such as playing a competitive game, watching an exciting program on television, or having an important discussion with a loved one. (3) Have caffeine in the evening (coffee, teas, chocolate, sodas, etc.) (4) Read or watch television in bed. (5) Use alcohol to help you sleep. (6) Go to bed too hungry or too full. (7) Take another person's sleeping pills. (8) Take over-the-counter sleeping pills, without your doctor's knowledge.  Tolerance can develop rapidly with these medications.  Diphenhydramine can have serious side effects for elderly patients. (9) Take daytime naps. (10) Command yourself to go to sleep.  This only makes your mind and body more alert.  If you lie awake for more than 20-30 minutes, get up, go to a different room, participate in a quiet activity (Ex - non-excitable reading or television), and then return to  bed when you feel sleepy.  Do this as many times during the night as needed.  This may cause you to have a night or two of poor sleep but it will train your brain to know when it is time for sleep.

## 2021-12-26 ENCOUNTER — Other Ambulatory Visit: Payer: Self-pay | Admitting: Physician Assistant

## 2021-12-26 MED ORDER — CETIRIZINE HCL 10 MG PO TABS: 10.0000 mg | ORAL_TABLET | Freq: Every day | ORAL | 0 refills | Status: DC

## 2021-12-26 MED ORDER — ALBUTEROL SULFATE HFA 108 (90 BASE) MCG/ACT IN AERS: 2.0000 | INHALATION_SPRAY | RESPIRATORY_TRACT | 1 refills | Status: DC | PRN

## 2021-12-26 MED ORDER — FLUTICASONE PROPIONATE 50 MCG/ACT NA SUSP: 1.0000 | Freq: Every day | NASAL | 1 refills | Status: DC

## 2022-01-28 ENCOUNTER — Encounter: Payer: Self-pay | Admitting: *Deleted

## 2022-03-30 ENCOUNTER — Other Ambulatory Visit: Payer: Self-pay | Admitting: Physician Assistant

## 2022-04-09 ENCOUNTER — Encounter: Payer: Self-pay | Admitting: Physician Assistant

## 2022-04-18 ENCOUNTER — Encounter: Payer: Self-pay | Admitting: *Deleted

## 2022-05-29 ENCOUNTER — Other Ambulatory Visit: Payer: Self-pay | Admitting: Physician Assistant

## 2022-06-03 ENCOUNTER — Other Ambulatory Visit: Payer: Self-pay | Admitting: Physician Assistant

## 2022-06-21 ENCOUNTER — Encounter: Payer: Self-pay | Admitting: Physician Assistant

## 2022-10-04 ENCOUNTER — Other Ambulatory Visit: Payer: Self-pay | Admitting: Physician Assistant

## 2022-10-27 ENCOUNTER — Other Ambulatory Visit: Payer: Self-pay | Admitting: Physician Assistant

## 2022-10-28 NOTE — Progress Notes (Signed)
Alexander Morrison is a 37 y.o. male here for a new problem.  History of Present Illness:   No chief complaint on file.   HPI  Right Elbow Pain   Past Medical History:  Diagnosis Date   Allergy      Social History   Tobacco Use   Smoking status: Former    Packs/day: .5    Types: Cigarettes    Quit date: 09/01/2012    Years since quitting: 10.1   Smokeless tobacco: Never  Vaping Use   Vaping Use: Every day  Substance Use Topics   Alcohol use: Yes    Alcohol/week: 2.0 standard drinks of alcohol    Types: 2 Shots of liquor per week    Comment: weekend bingeing   Drug use: Yes    Frequency: 3.0 times per week    Types: Marijuana    Past Surgical History:  Procedure Laterality Date   APPENDECTOMY     HIP PINNING      Family History  Problem Relation Age of Onset   Hypertension Mother    Hypertension Father    Hypertension Brother    Lymphoma Brother    Alzheimer's disease Maternal Grandmother    Colon cancer Maternal Grandfather 34       late 60's   Hypertension Paternal Grandmother    Heart disease Paternal Grandmother    Diabetes Paternal Grandmother    Diabetes Other    CAD Other    Cancer Other    Stroke Other    Prostate cancer Neg Hx     Allergies  Allergen Reactions   Shellfish Allergy Anaphylaxis   Iodine Swelling    Current Medications:   Current Outpatient Medications:    acetaminophen (TYLENOL) 325 MG tablet, Take 650 mg by mouth every 6 (six) hours as needed., Disp: , Rfl:    albuterol (VENTOLIN HFA) 108 (90 Base) MCG/ACT inhaler, INHALE 2 PUFFS INTO THE LUNGS EVERY 4 HOURS AS NEEDED FOR WHEEZING OR SHORTNESS OF BREATH., Disp: 8.5 each, Rfl: 0   calcium carbonate (TUMS - DOSED IN MG ELEMENTAL CALCIUM) 500 MG chewable tablet, Chew 1 tablet by mouth as needed for indigestion or heartburn., Disp: , Rfl:    cetirizine (ZYRTEC) 10 MG tablet, TAKE 1 TABLET BY MOUTH EVERY DAY, Disp: 90 tablet, Rfl: 0   citalopram (CELEXA) 10 MG tablet, TAKE 1  TABLET BY MOUTH EVERY DAY, Disp: 30 tablet, Rfl: 1   esomeprazole (NEXIUM) 40 MG capsule, Take 1 capsule (40 mg total) by mouth daily., Disp: 90 capsule, Rfl: 3   fluticasone (FLONASE) 50 MCG/ACT nasal spray, SPRAY 1 SPRAY INTO BOTH NOSTRILS DAILY., Disp: 16 mL, Rfl: 1   fluticasone (FLOVENT HFA) 44 MCG/ACT inhaler, Inhale 1 puff into the lungs 2 (two) times daily., Disp: 1 each, Rfl: 12   Review of Systems:   ROS  Vitals:   There were no vitals filed for this visit.   There is no height or weight on file to calculate BMI.  Physical Exam:   Physical Exam  Assessment and Plan:    ***   I,Alexander Ruley,acting as a scribe for Jarold Motto, PA.,have documented all relevant documentation on the behalf of Jarold Motto, PA,as directed by  Jarold Motto, PA while in the presence of Jarold Motto, [place].   ***   Jarold Motto, PA-C

## 2022-10-30 ENCOUNTER — Ambulatory Visit (INDEPENDENT_AMBULATORY_CARE_PROVIDER_SITE_OTHER): Payer: No Typology Code available for payment source | Admitting: Physician Assistant

## 2022-10-30 ENCOUNTER — Encounter: Payer: Self-pay | Admitting: Physician Assistant

## 2022-10-30 VITALS — BP 136/90 | HR 77 | Temp 97.5°F | Ht 71.5 in | Wt 307.5 lb

## 2022-10-30 DIAGNOSIS — R109 Unspecified abdominal pain: Secondary | ICD-10-CM

## 2022-10-30 DIAGNOSIS — J4540 Moderate persistent asthma, uncomplicated: Secondary | ICD-10-CM

## 2022-10-30 DIAGNOSIS — F32A Depression, unspecified: Secondary | ICD-10-CM

## 2022-10-30 DIAGNOSIS — M25521 Pain in right elbow: Secondary | ICD-10-CM | POA: Diagnosis not present

## 2022-10-30 DIAGNOSIS — F419 Anxiety disorder, unspecified: Secondary | ICD-10-CM

## 2022-10-30 MED ORDER — QVAR REDIHALER 40 MCG/ACT IN AERB: 2.0000 | INHALATION_SPRAY | Freq: Two times a day (BID) | RESPIRATORY_TRACT | 6 refills | Status: AC

## 2022-10-30 MED ORDER — MELOXICAM 15 MG PO TABS: 15.0000 mg | ORAL_TABLET | Freq: Every day | ORAL | 0 refills | Status: DC

## 2022-10-30 MED ORDER — ESOMEPRAZOLE MAGNESIUM 40 MG PO CPDR: 40.0000 mg | DELAYED_RELEASE_CAPSULE | Freq: Every day | ORAL | 3 refills | Status: DC

## 2022-10-30 NOTE — Patient Instructions (Signed)
It was great to see you!  Start miralax and colace daily If stomach issues persist, let me know and we will pursue further work-up  Continue Nexium -- sending in for you today  Start meloxicam 15 mg daily x 1 week for your elbow Trial exercises If no relief, let me know and we will refer to physical therapy vs sports medicine   Take care,  Jarold Motto PA-C   .last

## 2022-11-12 ENCOUNTER — Encounter: Payer: Self-pay | Admitting: Physician Assistant

## 2022-11-12 ENCOUNTER — Ambulatory Visit (INDEPENDENT_AMBULATORY_CARE_PROVIDER_SITE_OTHER): Payer: No Typology Code available for payment source | Admitting: Physician Assistant

## 2022-11-12 VITALS — BP 140/94 | HR 74 | Temp 97.8°F | Ht 71.5 in | Wt 308.0 lb

## 2022-11-12 DIAGNOSIS — Z0000 Encounter for general adult medical examination without abnormal findings: Secondary | ICD-10-CM | POA: Diagnosis not present

## 2022-11-12 DIAGNOSIS — M25521 Pain in right elbow: Secondary | ICD-10-CM | POA: Diagnosis not present

## 2022-11-12 DIAGNOSIS — F419 Anxiety disorder, unspecified: Secondary | ICD-10-CM

## 2022-11-12 DIAGNOSIS — R030 Elevated blood-pressure reading, without diagnosis of hypertension: Secondary | ICD-10-CM | POA: Diagnosis not present

## 2022-11-12 DIAGNOSIS — Z13220 Encounter for screening for lipoid disorders: Secondary | ICD-10-CM | POA: Diagnosis not present

## 2022-11-12 DIAGNOSIS — E669 Obesity, unspecified: Secondary | ICD-10-CM | POA: Diagnosis not present

## 2022-11-12 DIAGNOSIS — R29818 Other symptoms and signs involving the nervous system: Secondary | ICD-10-CM

## 2022-11-12 DIAGNOSIS — F32A Depression, unspecified: Secondary | ICD-10-CM

## 2022-11-12 DIAGNOSIS — Z6841 Body Mass Index (BMI) 40.0 and over, adult: Secondary | ICD-10-CM | POA: Diagnosis not present

## 2022-11-12 LAB — CBC WITH DIFFERENTIAL/PLATELET
Basophils Absolute: 0 10*3/uL (ref 0.0–0.1)
Basophils Relative: 0.8 % (ref 0.0–3.0)
Eosinophils Absolute: 0.1 10*3/uL (ref 0.0–0.7)
Eosinophils Relative: 2.9 % (ref 0.0–5.0)
HCT: 43.8 % (ref 39.0–52.0)
Hemoglobin: 14.1 g/dL (ref 13.0–17.0)
Lymphocytes Relative: 39 % (ref 12.0–46.0)
Lymphs Abs: 1.9 10*3/uL (ref 0.7–4.0)
MCHC: 32.1 g/dL (ref 30.0–36.0)
MCV: 87.7 fl (ref 78.0–100.0)
Monocytes Absolute: 0.4 10*3/uL (ref 0.1–1.0)
Monocytes Relative: 8.4 % (ref 3.0–12.0)
Neutro Abs: 2.3 10*3/uL (ref 1.4–7.7)
Neutrophils Relative %: 48.9 % (ref 43.0–77.0)
Platelets: 310 10*3/uL (ref 150.0–400.0)
RBC: 4.99 Mil/uL (ref 4.22–5.81)
RDW: 13.7 % (ref 11.5–15.5)
WBC: 4.8 10*3/uL (ref 4.0–10.5)

## 2022-11-12 LAB — COMPREHENSIVE METABOLIC PANEL
ALT: 44 U/L (ref 0–53)
AST: 28 U/L (ref 0–37)
Albumin: 4.4 g/dL (ref 3.5–5.2)
Alkaline Phosphatase: 82 U/L (ref 39–117)
BUN: 16 mg/dL (ref 6–23)
CO2: 29 mEq/L (ref 19–32)
Calcium: 9.7 mg/dL (ref 8.4–10.5)
Chloride: 103 mEq/L (ref 96–112)
Creatinine, Ser: 1.07 mg/dL (ref 0.40–1.50)
GFR: 90.19 mL/min (ref >60.00)
Glucose, Bld: 94 mg/dL (ref 70–99)
Potassium: 4.2 mEq/L (ref 3.5–5.1)
Sodium: 137 mEq/L (ref 135–145)
Total Bilirubin: 0.5 mg/dL (ref 0.2–1.2)
Total Protein: 7.3 g/dL (ref 6.0–8.3)

## 2022-11-12 LAB — LIPID PANEL
Cholesterol: 182 mg/dL (ref 0–200)
HDL: 47.8 mg/dL (ref >39.00)
LDL Cholesterol: 100 mg/dL — ABNORMAL HIGH (ref 0–99)
NonHDL: 134.64
Total CHOL/HDL Ratio: 4
Triglycerides: 174 mg/dL — ABNORMAL HIGH (ref 0.0–149.0)
VLDL: 34.8 mg/dL (ref 0.0–40.0)

## 2022-11-12 LAB — HEMOGLOBIN A1C: Hgb A1c MFr Bld: 5.7 % (ref 4.6–6.5)

## 2022-11-12 NOTE — Patient Instructions (Addendum)
It was great to see you!  Referrals placed today: -Sports medicine -Neurology for Sleep Studies -Talk therapy  Keep an eye on your blood pressure and message me if continues >130/80  Please go to the lab for blood work.   Our office will call you with your results unless you have chosen to receive results via MyChart.  If your blood work is normal we will follow-up each year for physicals and as scheduled for chronic medical problems.  If anything is abnormal we will treat accordingly and get you in for a follow-up.  Take care,  Lelon Mast

## 2022-11-12 NOTE — Progress Notes (Signed)
Subjective:    Alexander Morrison is a 37 y.o. male and is here for a comprehensive physical exam.  HPI  There are no preventive care reminders to display for this patient.  Acute Concerns: He denies numbness, tingling, or swelling in his legs.   Mental health:  He is requesting a referral to see a counselor.  His mental health is good but he wants to see one for preventative reasons.  Denies Suicidal ideation/HI  Blood pressure: His blood pressure is elevated during this visit.  He continues taking meloxicam for elbow pain.  Patient also notes from stressed earlier today due to losing keys.   BP Readings from Last 3 Encounters:  11/12/22 (!) 140/94  10/30/22 (!) 136/90  11/07/21 122/80   Pulse Readings from Last 3 Encounters:  11/12/22 74  10/30/22 77  11/07/21 84   Family medical history:  His father no longer has high blood pressure.  He grandparents have a history of diabetes.  His brother has a history of lymphoma.   Social history:  He only had 1 beer since coming back from his vacation.  He does not plan on drinking more often. He vapes marijuana 3x weekly.    Chronic Issues: Elbow pain: His elbow pain has not improved not worsened since last visit.  He continues taking meloxicam to manage his symptoms.  He is interested in seeing a specialist for further treatment.   Sleep study:  He is requesting a referral to see a sleep specialist.   Blood sugar:  His last A1C was in pre-diabetic range.  He is interested in measuring his A1c during this visit.  Lab Results  Component Value Date   HGBA1C 5.9 10/10/2021   Health Maintenance: Immunizations -- N/A Colonoscopy -- N/A PSA -- No results found for: "PSA1", "PSA" Diet -- He is improving his diet since last visit.  He is eating at home more often.  He started drinking healthy juices recently.  Sleep habits -- He is snoring more often while sleeping.  His wife is monitoring his sleep and making sure  he does not sleep on his back.  Exercise -- He is not exercising regularly.  He occasionally walks outside with his baby.  He cuts his grass 1x weekly.   Weight -- @FLOWAMB (14)@  Recent weight history Wt Readings from Last 10 Encounters:  11/12/22 (!) 308 lb (139.7 kg)  10/30/22 (!) 307 lb 8 oz (139.5 kg)  11/07/21 296 lb 8 oz (134.5 kg)  10/10/21 (!) 300 lb 6.1 oz (136.3 kg)  05/10/21 (!) 311 lb 6.1 oz (141.2 kg)  11/03/19 294 lb (133.4 kg)  05/14/19 296 lb (134.3 kg)  10/23/18 (!) 302 lb 6.1 oz (137.2 kg)  03/04/18 300 lb (136.1 kg)  02/17/18 (!) 300 lb 3.2 oz (136.2 kg)   Body mass index is 42.36 kg/m.  Mood -- Patient reports his mood is good.   Alcohol use --  reports current alcohol use of about 2.0 standard drinks of alcohol per week.  Tobacco use --  Tobacco Use: Medium Risk (11/12/2022)   Patient History    Smoking Tobacco Use: Former    Smokeless Tobacco Use: Never    Passive Exposure: Not on file    Eligible for Low Dose CT? No  UTD with eye doctor? No UTD with dentist? No     11/12/2022    8:53 AM  Depression screen PHQ 2/9  Decreased Interest 1  Down, Depressed, Hopeless 1  PHQ -  2 Score 2  Altered sleeping 0  Tired, decreased energy 1  Change in appetite 1  Feeling bad or failure about yourself  0  Trouble concentrating 0  Moving slowly or fidgety/restless 0  Suicidal thoughts 0  PHQ-9 Score 4  Difficult doing work/chores Not difficult at all    Other providers/specialists: Patient Care Team: Jarold Motto, [place] as PCP - General (Physician Assistant)    PMHx, SurgHx, SocialHx, Medications, and Allergies were reviewed in the Visit Navigator and updated as appropriate.   Past Medical History:  Diagnosis Date   Allergy      Past Surgical History:  Procedure Laterality Date   APPENDECTOMY     HIP PINNING       Family History  Problem Relation Age of Onset   Hypertension Mother    Hypertension Father        no longer on meds    Hypertension Brother    Lymphoma Brother    Alzheimer's disease Maternal Grandmother    Colon cancer Maternal Grandfather 65       late 60's   Hypertension Paternal Grandmother    Heart disease Paternal Grandmother    Diabetes Paternal Grandmother    Diabetes Other    CAD Other    Cancer Other    Stroke Other    Prostate cancer Neg Hx     Social History   Tobacco Use   Smoking status: Former    Packs/day: .5    Types: Cigarettes    Quit date: 09/01/2012    Years since quitting: 10.2   Smokeless tobacco: Never  Vaping Use   Vaping Use: Every day  Substance Use Topics   Alcohol use: Yes    Alcohol/week: 2.0 standard drinks of alcohol    Types: 2 Shots of liquor per week    Comment: weekend bingeing   Drug use: Yes    Frequency: 7.0 times per week    Types: Marijuana    Comment: vapes    Review of Systems:   Review of Systems  Constitutional:  Negative for chills, fever, malaise/fatigue and weight loss.  HENT:  Negative for hearing loss, sinus pain and sore throat.   Respiratory:  Negative for cough and hemoptysis.        (+)snoring while sleeping  Cardiovascular:  Negative for chest pain, palpitations, leg swelling and PND.  Gastrointestinal:  Negative for abdominal pain, constipation, diarrhea, heartburn, nausea and vomiting.  Genitourinary:  Negative for dysuria, frequency and urgency.  Musculoskeletal:  Negative for back pain, myalgias and neck pain.  Skin:  Negative for itching and rash.  Neurological:  Negative for dizziness, tingling, seizures and headaches.  Endo/Heme/Allergies:  Negative for polydipsia.  Psychiatric/Behavioral:  Negative for depression. The patient is not nervous/anxious.     Objective:    Vitals:   11/12/22 0849 11/12/22 0915  BP: (!) 140/90 (!) 140/94  Pulse: 74   Temp: 97.8 F (36.6 C)   SpO2: 95%     Body mass index is 42.36 kg/m.  General  Alert, cooperative, no distress, appears stated age  Head:  Normocephalic, without  obvious abnormality, atraumatic  Eyes:  PERRL, conjunctiva/corneas clear, EOM's intact, fundi benign, both eyes       Ears:  Normal TM's and external ear canals, both ears  Nose: Nares normal, septum midline, mucosa normal, no drainage or sinus tenderness  Throat: Lips, mucosa, and tongue normal; teeth and gums normal  Neck: Supple, symmetrical, trachea midline, no adenopathy;  thyroid:  No enlargement/tenderness/nodules; no carotid bruit or JVD  Back:   Symmetric, no curvature, ROM normal, no CVA tenderness  Lungs:   Clear to auscultation bilaterally, respirations unlabored  Chest wall:  No tenderness or deformity  Heart:  Regular rate and rhythm, S1 and S2 normal, no murmur, rub or gallop  Abdomen:   Soft, non-tender, bowel sounds active all four quadrants, no masses, no organomegaly  Extremities: Extremities normal, atraumatic, no cyanosis or edema  Prostate : Deferred   Skin: Skin color, texture, turgor normal, no rashes or lesions  Lymph nodes: Cervical, supraclavicular, and axillary nodes normal  Neurologic: CNII-XII grossly intact. Normal strength, sensation and reflexes throughout   AssessmentPlan:   Routine physical examination Today patient counseled on age appropriate routine health concerns for screening and prevention, each reviewed and up to date or declined. Immunizations reviewed and up to date or declined. Labs ordered and reviewed. Risk factors for depression reviewed and negative. Hearing function and visual acuity are intact. ADLs screened and addressed as needed. Functional ability and level of safety reviewed and appropriate. Education, counseling and referrals performed based on assessed risks today. Patient provided with a copy of personalized plan for preventive services.  Suspected sleep apnea Referral to [facility]   Right elbow pain Did not respond to mobic -- discontinue Referral to sports medicine  Anxiety and depression Well  controlled Referral to talk therapy  Obesity, unspecified classification, unspecified obesity type, unspecified whether serious comorbidity present Continue healthy lifestyle efforts Update A1c to continue to trend overall blood sugars - consider prescription if A1c > 6  Elevated blood pressure reading Above goal today No evidence of end-organ damage on my exam Recommend patient monitor home blood pressure at least a few times weekly If home monitoring shows consistent elevation, or any symptom(s) develop, recommend reach out to [place] for further advice on next steps   [employer] as a scribe for [employer], PA.,have documented all relevant documentation on the behalf of Jarold Motto, PA,as directed by  Jarold Motto, PA while in the presence of Jarold Motto, [place].  I, Jarold Motto, [place], have reviewed all documentation for this visit. The documentation on 11/12/22 for the exam, diagnosis, procedures, and orders are all accurate and complete.   Jarold Motto, PA-C [facility]

## 2022-11-29 NOTE — Progress Notes (Unsigned)
   Rubin Payor, PhD, LAT, ATC acting as a scribe for Clementeen Graham, MD.  Alexander Morrison is a 37 y.o. male who presents to [facility] today for R elbow pain ongoing since early June. Pt locates pain to ***  Radiates: Paresthesia: Grip strength: Aggravates: Treatments tried: meloxicam, heat, ice, IBU  Pertinent review of systems: ***  Relevant historical information: ***   Exam:  There were no vitals taken for this visit. General: Well Developed, well nourished, and in no acute distress.   MSK: ***    Lab and Radiology Results No results found for this or any previous visit (from the past 72 hour(s)). No results found.     Assessment and Plan: 37 y.o. male with ***   PDMP not reviewed this encounter. No orders of the defined types were placed in this encounter.  No orders of the defined types were placed in this encounter.    Discussed warning signs or symptoms. Please see discharge instructions. Patient expresses understanding.   ***

## 2022-12-02 ENCOUNTER — Ambulatory Visit (INDEPENDENT_AMBULATORY_CARE_PROVIDER_SITE_OTHER): Payer: No Typology Code available for payment source | Admitting: Family Medicine

## 2022-12-02 ENCOUNTER — Other Ambulatory Visit: Payer: Self-pay

## 2022-12-02 ENCOUNTER — Ambulatory Visit (INDEPENDENT_AMBULATORY_CARE_PROVIDER_SITE_OTHER): Payer: No Typology Code available for payment source

## 2022-12-02 ENCOUNTER — Encounter: Payer: Self-pay | Admitting: Family Medicine

## 2022-12-02 VITALS — BP 130/88 | HR 80 | Ht 71.5 in | Wt 310.0 lb

## 2022-12-02 DIAGNOSIS — M25521 Pain in right elbow: Secondary | ICD-10-CM | POA: Diagnosis not present

## 2022-12-02 NOTE — Patient Instructions (Addendum)
Thank you for coming in today.  Please get an Xray today before you leave  Theraband Flex Bar  Topical Voltaren Gel 1% - available over-the-counter.   Tennis elbow strap.   If not better let me know and I will order hand therapy.   There is more to do if needed like injection.

## 2022-12-04 ENCOUNTER — Ambulatory Visit (INDEPENDENT_AMBULATORY_CARE_PROVIDER_SITE_OTHER): Payer: No Typology Code available for payment source | Admitting: Psychology

## 2022-12-04 DIAGNOSIS — F4321 Adjustment disorder with depressed mood: Secondary | ICD-10-CM | POA: Diagnosis not present

## 2022-12-04 NOTE — Progress Notes (Signed)
[facility] Counselor Initial Adult Exam  Name: Alexander Morrison Date: 12/04/2022 MRN: [id number] DOB: 07/28/1985 PCP: Alexander Motto, PA  Time spent: 45 mins     start time: 1300 end time: 1345  Guardian/Payee:  Pt    Paperwork requested: No   Reason for Visit /Presenting Problem: Pt presents for session via Caregility video.  Pt shares that he understands the limits of virtual sessions and that he is in his home with no one else present and grants consent for the session.  I shared with pt that I am in my office with no one else here either.  Mental Status Exam: Appearance:   Casual     Behavior:  Appropriate  Motor:  Normal  Speech/Language:   Clear and Coherent  Affect:  Appropriate  Mood:  normal  Thought process:  normal  Thought content:    WNL  Sensory/Perceptual disturbances:    WNL  Orientation:  oriented to person, place, and time/date  Attention:  Good  Concentration:  Good  Memory:  WNL  Fund of knowledge:   Good  Insight:    Good  Judgment:   Good  Impulse Control:  Good   Reported Symptoms:  Pt shares that he wants to have therapy to give him greater coping mechanisms and skills to deal with life stresses  Risk Assessment: Danger to Self:  No Self-injurious Behavior: No Danger to Others: No Duty to Warn:no Physical Aggression / Violence:No  Access to Firearms a concern: No  Gang Involvement:No  Patient / guardian was educated about steps to take if suicide or homicide risk level increases between visits: n/a While future psychiatric events cannot be accurately predicted, the patient does not currently require acute inpatient psychiatric care and does not currently meet [facility] involuntary commitment criteria.  Substance Abuse History: Current substance abuse:  History of opioid abuse; smokes pot daily; also uses nicotine via vaping; also uses alcohol socially  Past Psychiatric History:   No previous psychological problems have  been observed Outpatient Providers:none History of Psych Hospitalization: No  Psychological Testing:  none    Abuse History:  Victim of: No.,  none    Report needed: No. Victim of Neglect:No. Perpetrator of  none   Witness / Exposure to Domestic Violence: No   Protective Services Involvement: No  Witness to [employer] Violence:  No   Family History:  Family History  Problem Relation Age of Onset   Hypertension Mother    Hypertension Father        no longer on meds   Hypertension Brother    Lymphoma Brother    Alzheimer's disease Maternal Grandmother    Colon cancer Maternal Grandfather 26       late 60's   Hypertension Paternal Grandmother    Heart disease Paternal Grandmother    Diabetes Paternal Grandmother    Diabetes Other    CAD Other    Cancer Other    Stroke Other    Prostate cancer Neg Hx     Living situation: the patient lives with their family; pt grew up all over because his father was in the [employer]; pt's mom lives about 20 mins from pt; his father lives in [place]; they have a decent relationship at this point.  Pt does not blame his dad for the divorce; it was hard on pt but he understands why his dad left.  Brother lives in [place]  Sexual Orientation: Straight  Relationship Status: married in late 2019 Name  of spouse / other: Alexander Morrison; they met in a registration line at [facility] If a parent, number of children / ages: Alexander Morrison (10 mo)  Support Systems: spouse Older brother; friend group  Financial Stress:  No ; can pay bills well  Income/Employment/Disability: Employment with a Educational psychologist; works from home; Alexander Morrison works in Education officer, environmental for [employer] Service: No   Educational History: Education: some college  Religion/Sprituality/World View: Agnostic  Any cultural differences that may affect / interfere with treatment:  not applicable   Recreation/Hobbies: music; rapper and makes songs; video games; with Alexander Morrison at home, it is hard to find  time for these activities  Stressors: Other: Pt shares he has a impending sense of doom; he watched his family lose things after his parents divorced when he was young    Strengths: Supportive Relationships and Family  Barriers:  none  Legal History: Pending legal issue / charges: The patient has no significant history of legal issues. History of legal issue / charges:  none  Medical History/Surgical History: reviewed Past Medical History:  Diagnosis Date   Allergy     Past Surgical History:  Procedure Laterality Date   APPENDECTOMY     HIP PINNING      Medications: Current Outpatient Medications  Medication Sig Dispense Refill   acetaminophen (TYLENOL) 325 MG tablet Take 650 mg by mouth every 6 (six) hours as needed.     albuterol (VENTOLIN HFA) 108 (90 Base) MCG/ACT inhaler INHALE 2 PUFFS BY MOUTH EVERY 4 HOURS AS NEEDED FOR WHEEZE OR FOR SHORTNESS OF BREATH 8.5 each 0   beclomethasone (QVAR REDIHALER) 40 MCG/ACT inhaler Inhale 2 puffs into the lungs 2 (two) times daily. 1 each 6   calcium carbonate (TUMS - DOSED IN MG ELEMENTAL CALCIUM) 500 MG chewable tablet Chew 1 tablet by mouth as needed for indigestion or heartburn.     cetirizine (ZYRTEC) 10 MG tablet TAKE 1 TABLET BY MOUTH EVERY DAY 90 tablet 0   esomeprazole (NEXIUM) 40 MG capsule Take 1 capsule (40 mg total) by mouth daily. 90 capsule 3   fluticasone (FLONASE) 50 MCG/ACT nasal spray SPRAY 1 SPRAY INTO BOTH NOSTRILS DAILY. 16 mL 1   meloxicam (MOBIC) 15 MG tablet Take 1 tablet (15 mg total) by mouth daily. 30 tablet 0   No current facility-administered medications for this visit.    Allergies  Allergen Reactions   Shellfish Allergy Anaphylaxis   Iodine Swelling    Diagnoses:  Adjustment disorder with depressed mood  Plan of Care: Encouraged pt to be thinking about what self care activities he wants to pursue and we will meet in 2 wks for a follow up session  (12/19/22).   Karie Kirks, [facility]

## 2022-12-06 NOTE — Progress Notes (Signed)
Right elbow x-ray looks normal to radiology

## 2022-12-19 ENCOUNTER — Ambulatory Visit (INDEPENDENT_AMBULATORY_CARE_PROVIDER_SITE_OTHER): Payer: No Typology Code available for payment source | Admitting: Psychology

## 2022-12-19 DIAGNOSIS — F4321 Adjustment disorder with depressed mood: Secondary | ICD-10-CM

## 2022-12-19 NOTE — Progress Notes (Signed)
   [facility] Counselor/Therapist Progress Note  Patient ID: Alexander Morrison, MRN: [id number],    Date: 12/19/2022  Time Spent: 45 mins  start time: 1300   end time: 1345  Treatment Type: Individual Therapy  Reported Symptoms: Pt presents for session via Caregility video.  He grants consent for the session, stating that he is in his home with no one else present; pt shares that he understands the limits of virtual sessions.  I shared with pt that I am in my office with no one else present.     Mental Status Exam: Appearance:  Casual     Behavior: Appropriate  Motor: Normal  Speech/Language:  Clear and Coherent  Affect: Appropriate  Mood: normal  Thought process: normal  Thought content:   WNL  Sensory/Perceptual disturbances:   WNL  Orientation: oriented to person, place, and time/date  Attention: Good  Concentration: Good  Memory: WNL  Fund of knowledge:  Good  Insight:   Good  Judgment:  Good  Impulse Control: Good   Risk Assessment: Danger to Self:  No Self-injurious Behavior: No Danger to Others: No Duty to Warn:no Physical Aggression / Violence:No  Access to Firearms a concern: No  Gang Involvement:No   Subjective: Pt shares he has been "up and down in the past couple of weeks.  COVID is gone from our home now and that is great."  Pt shares that he has heard more and more people recently engage in more political perspectives recently and that has been "bumming me out."  Pt shares he wants to do something that benefits the country and the world that make life better for his daughter and nephews.  Talked with pt about the benefits of having an experience every day where he can be present and enjoy the moment and not having to think about what he does or does not have.  Talked with pt about the importance of teaching Alexander Morrison how a man should treat her well and how she can support her husband.    Interventions: Cognitive Behavioral Therapy  Diagnosis:Adjustment  disorder with depressed mood  Plan: Treatment Plan Strengths/Abilities:  Intelligent, Intuitive, Willing to participate in therapy Treatment Preferences:  Outpatient Individual Therapy Statement of Needs:  Patient is to use CBT, mindfulness and coping skills to help manage and/or decrease symptoms associated with their diagnosis. Symptoms:  Depressed/Irritable mood, worry, social withdrawal Problems Addressed:  Depressive thoughts, Sadness, Sleep issues, etc. Long Term Goals:  Pt to reduce overall level, frequency, and intensity of the feelings of depression as evidenced by decreased irritability, negative self talk, and helpless feelings from 6 to 7 days/week to 0 to 1 days/week, per client report, for at least 3 consecutive months.  Progress: 30% Short Term Goals:  Pt to verbally express understanding of the relationship between feelings of depression and their impact on thinking patterns and behaviors.  Pt to verbalize an understanding of the role that distorted thinking plays in creating fears, excessive worry, and ruminations.  Progress: 30% Target Date:  12/19/2023 Frequency:  Bi-weekly Modality:  Cognitive Behavioral Therapy Interventions by Therapist:  Therapist will use CBT, Mindfulness exercises, Coping skills and Referrals, as needed by client. Client has verbally approved this treatment plan.  Karie Kirks, [facility]

## 2022-12-30 ENCOUNTER — Ambulatory Visit (INDEPENDENT_AMBULATORY_CARE_PROVIDER_SITE_OTHER): Payer: No Typology Code available for payment source | Admitting: Neurology

## 2022-12-30 ENCOUNTER — Encounter: Payer: Self-pay | Admitting: Neurology

## 2022-12-30 VITALS — BP 146/91 | HR 85 | Ht 71.0 in | Wt 309.0 lb

## 2022-12-30 DIAGNOSIS — R0683 Snoring: Secondary | ICD-10-CM

## 2022-12-30 DIAGNOSIS — R0681 Apnea, not elsewhere classified: Secondary | ICD-10-CM | POA: Diagnosis not present

## 2022-12-30 DIAGNOSIS — R519 Headache, unspecified: Secondary | ICD-10-CM

## 2022-12-30 DIAGNOSIS — G4719 Other hypersomnia: Secondary | ICD-10-CM

## 2022-12-30 DIAGNOSIS — Z9189 Other specified personal risk factors, not elsewhere classified: Secondary | ICD-10-CM | POA: Diagnosis not present

## 2022-12-30 NOTE — Progress Notes (Signed)
Subjective:    Patient ID: Alexander Morrison, male    DOB: [birth year], 37 y.o.   MRN: [id number]  HPI    Alexander Foley, MD, PhD [facility] [address] [address] [place], [place] [address]  Dear Alexander Morrison,   I saw your patient, Alexander Morrison, upon your kind request, in my sleep clinic today for initial consultation of his sleep disorder, in particular, concern for underlying obstructive sleep apnea.  The patient is unaccompanied today.  He missed an appointment on 08/27/21. As you know, Alexander Morrison is a 37 year old right-handed gentleman with an underlying medical history of allergies, asthma, reflux disease, elevated blood pressure readings, smoking, and severe obesity with a BMI of over 40, who reports snoring and excessive daytime somnolence.  I reviewed your office note from 05/10/2021, as well as 11/12/2022.  He has had elevated blood pressure values. His Epworth sleepiness score is 2 out of 24, fatigue severity score is 48 out of 63.  He reports witnessed apneic pauses while asleep.  He lives with his wife and 23-month-old daughter.  Sleep-related symptoms have been ongoing for at least 2 years.  He used to wake up at night with anxiety, the symptoms have improved.  He quit smoking cigarettes in 2 years ago, he vapes nicotine currently daily but is trying to quit eventually.  They have 2 dogs in the household but none of them sleep in the bedroom at night.  They do have a TV in the bedroom but it is not on at night.  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  Bedtime is generally around 10 PM and rise time between 6:30 AM and 7 AM.  He does not have nightly nocturia but has had an occasional morning headache which is dull and achy.  His father has sleep apnea.  His Past Medical History Is Significant For: Past Medical History:  Diagnosis Date   Allergy     His Past Surgical History Is Significant For: Past Surgical History:  Procedure Laterality Date    APPENDECTOMY     HIP PINNING      His Family History Is Significant For: Family History  Problem Relation Age of Onset   Hypertension Mother    Hypertension Father        no longer on meds   Hypertension Brother    Lymphoma Brother    Alzheimer's disease Maternal Grandmother    Colon cancer Maternal Grandfather 35       late 60's   Hypertension Paternal Grandmother    Heart disease Paternal Grandmother    Diabetes Paternal Grandmother    Diabetes Other    CAD Other    Cancer Other    Stroke Other    Prostate cancer Neg Hx     His Social History Is Significant For: Social History   Socioeconomic History   Marital status: Married    Spouse name: Not on file   Number of children: Not on file   Years of education: Not on file   Highest education level: Not on file  Occupational History   Not on file  Tobacco Use   Smoking status: Former    Current packs/day: 0.00    Types: Cigarettes    Quit date: 09/01/2012    Years since quitting: 10.3   Smokeless tobacco: Never  Vaping Use   Vaping status: Every Day  Substance and Sexual Activity   Alcohol use: Yes    Alcohol/week: 2.0 standard  drinks of alcohol    Types: 2 Shots of liquor per week    Comment: weekend bingeing   Drug use: Yes    Frequency: 7.0 times per week    Types: Marijuana    Comment: vapes   Sexual activity: Yes  Other Topics Concern   Not on file  Social History Narrative   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 [place]   Planning    Is a Manufacturing engineer, perform around town, since    Social Determinants of Corporate investment banker Strain: Not on file  Food Insecurity: Not on file  Transportation Needs: Not on file  Physical Activity: Not on file  Stress: Not on file  Social Connections: Not on file    His Allergies Are:  Allergies  Allergen Reactions   Shellfish Allergy Anaphylaxis   Iodine Swelling  :   His Current Medications Are:  Outpatient Encounter Medications as of 12/30/2022   Medication Sig   acetaminophen (TYLENOL) 325 MG tablet Take 650 mg by mouth every 6 (six) hours as needed.   albuterol (VENTOLIN HFA) 108 (90 Base) MCG/ACT inhaler INHALE 2 PUFFS BY MOUTH EVERY 4 HOURS AS NEEDED FOR WHEEZE OR FOR SHORTNESS OF BREATH   beclomethasone (QVAR REDIHALER) 40 MCG/ACT inhaler Inhale 2 puffs into the lungs 2 (two) times daily.   calcium carbonate (TUMS - DOSED IN MG ELEMENTAL CALCIUM) 500 MG chewable tablet Chew 1 tablet by mouth as needed for indigestion or heartburn.   cetirizine (ZYRTEC) 10 MG tablet TAKE 1 TABLET BY MOUTH EVERY DAY   esomeprazole (NEXIUM) 40 MG capsule Take 1 capsule (40 mg total) by mouth daily.   fluticasone (FLONASE) 50 MCG/ACT nasal spray SPRAY 1 SPRAY INTO BOTH NOSTRILS DAILY.   [DISCONTINUED] meloxicam (MOBIC) 15 MG tablet Take 1 tablet (15 mg total) by mouth daily.   No facility-administered encounter medications on file as of 12/30/2022.  :   Review of Systems:  Out of a complete 14 point review of systems, all are reviewed and negative with the exception of these symptoms as listed below:  Review of Systems  Neurological:        Rm 5. Alone. NP / internal / excessive snoring / Alexander Morrison / no prior sleep study per patient. He states several years ago he had apnea events causing him to wake up.       Objective:   Physical Exam   Physical Examination:   Vitals:   12/30/22 1024  BP: (!) 146/91  Pulse: 85    General Examination: The patient is a very pleasant 37 y.o. male in no acute distress. He appears well-developed and well-nourished and well groomed.   HEENT: Normocephalic, atraumatic, pupils are equal, round and reactive to light, extraocular tracking is good without limitation to gaze excursion or nystagmus noted. Hearing is grossly intact. Face is symmetric with normal facial animation. Speech is clear with no dysarthria noted. There is no hypophonia. There is no lip, neck/head, jaw or voice tremor. Neck is supple  with full range of passive and active motion. There are no carotid bruits on auscultation. Oropharynx exam reveals: mild mouth dryness, good dental hygiene and moderate airway crowding, due to Mallampati class IV, tonsils and uvula not fully visualized.  Tongue protrudes centrally and palate elevates symmetrically.  No significant overbite.  Neck circumference 17-5/8 inches.  Chest: Clear to auscultation without wheezing, rhonchi or crackles noted.  Heart: S1+S2+0, regular and normal without murmurs, rubs or gallops noted.  Abdomen: Soft, non-tender and non-distended.  Extremities: There is no pitting edema in the distal lower extremities bilaterally.   Skin: Warm and dry without trophic changes noted.   Musculoskeletal: exam reveals no obvious joint deformities.   Neurologically:  Mental status: The patient is awake, alert and oriented in all 4 spheres. His immediate and remote memory, attention, language skills and fund of knowledge are appropriate. There is no evidence of aphasia, agnosia, apraxia or anomia. Speech is clear with normal prosody and enunciation. Thought process is linear. Mood is normal and affect is normal.  Cranial nerves II - XII are as described above under HEENT exam.  Motor exam: Normal bulk, strength and tone is noted. There is no obvious action or resting tremor.  Fine motor skills and coordination: grossly intact.  Cerebellar testing: No dysmetria or intention tremor. There is no truncal or gait ataxia.  Sensory exam: intact to light touch in the upper and lower extremities.  Gait, station and balance: He stands easily. No veering to one side is noted. No leaning to one side is noted. Posture is age-appropriate and stance is narrow based. Gait shows normal stride length and normal pace. No problems turning are noted.       Assessment & Plan:  In summary, HUE FEEMAN is a very pleasant 37 y.o.-year old male with an underlying medical history of allergies,  asthma, reflux disease, elevated blood pressure readings, smoking, and severe obesity with a BMI of over 40, whose history and physical exam are concerning for sleep disordered breathing, particularly obstructive sleep apnea (OSA).  While a laboratory attended sleep study is typically considered "gold standard" for evaluation of sleep disordered breathing, we mutually agreed to proceed with a home sleep test at this time.   I had a long chat with the patient about my findings and the diagnosis of sleep apnea, particularly OSA, its prognosis and treatment options. We talked about medical/conservative treatments, surgical interventions and non-pharmacological approaches for symptom control. I explained, in particular, the risks and ramifications of untreated moderate to severe OSA, especially with respect to developing cardiovascular disease down the road, including congestive heart failure (CHF), difficult to treat hypertension, cardiac arrhythmias (particularly A-fib), neurovascular complications including TIA, stroke and dementia. Even type 2 diabetes has, in part, been linked to untreated OSA. Symptoms of untreated OSA may include (but may not be limited to) daytime sleepiness, nocturia (i.e. frequent nighttime urination), memory problems, mood irritability and suboptimally controlled or worsening mood disorder such as depression and/or anxiety, lack of energy, lack of motivation, physical discomfort, as well as recurrent headaches, especially morning or nocturnal headaches. We talked about the importance of maintaining a healthy lifestyle and striving for healthy weight.  The importance of complete nicotine cessation was also addressed.  In addition, we talked about the importance of striving for and maintaining good sleep hygiene. I recommended a sleep study at this time. I outlined the differences between a laboratory attended sleep study which is considered more comprehensive and accurate over the option of  a home sleep test (HST); the latter may lead to underestimation of sleep disordered breathing in some instances and does not help with diagnosing upper airway resistance syndrome and is not accurate enough to diagnose primary central sleep apnea typically. I outlined possible surgical and non-surgical treatment options of OSA, including the use of a positive airway pressure (PAP) device (i.e. CPAP, AutoPAP/APAP or BiPAP in certain circumstances), a custom-made dental device (aka oral appliance, which would require a  referral to a specialist dentist or orthodontist typically, and is generally speaking not considered for patients with full dentures or edentulous state), upper airway surgical options, such as traditional UPPP (which is not considered a first-line treatment) or the Inspire device (hypoglossal nerve stimulator, which would involve a referral for consultation with an ENT surgeon, after careful selection, following inclusion criteria - also not first-line treatment). I explained the PAP treatment option to the patient in detail, as this is generally considered first-line treatment.  The patient indicated that he would be willing to try PAP therapy, if the need arises. I explained the importance of being compliant with PAP treatment, not only for insurance purposes but primarily to improve patient's symptoms symptoms, and for the patient's long term health benefit, including to reduce His cardiovascular risks longer-term.    We will pick up our discussion about the next steps and treatment options after testing.  We will keep him posted as to the test results by phone call and/or MyChart messaging where possible.  We will plan to follow-up in sleep clinic accordingly as well.  I answered all his questions today and the patient was in agreement.   I encouraged him to call with any interim questions, concerns, problems or updates or email [place] through MyChart.  Generally speaking, sleep test authorizations  may take up to 2 weeks, sometimes less, sometimes longer, the patient is encouraged to get in touch with [place] if they do not hear back from the sleep lab staff directly within the next 2 weeks.  Thank you very much for allowing me to participate in the care of this nice patient. If I can be of any further assistance to you please do not hesitate to call me at [phone].  Sincerely,   Alexander Foley, MD, PhD

## 2022-12-30 NOTE — Patient Instructions (Signed)

## 2023-01-03 ENCOUNTER — Ambulatory Visit (INDEPENDENT_AMBULATORY_CARE_PROVIDER_SITE_OTHER): Payer: No Typology Code available for payment source | Admitting: Psychology

## 2023-01-03 DIAGNOSIS — F4321 Adjustment disorder with depressed mood: Secondary | ICD-10-CM

## 2023-01-03 NOTE — Progress Notes (Signed)
[facility] Counselor/Therapist Progress Note  Patient ID: Alexander Morrison, MRN: [id number],    Date: 01/03/2023  Time Spent: 45 mins  start time: 1300   end time: 1345  Treatment Type: Individual Therapy  Reported Symptoms: Pt presents for session via Caregility video.  He grants consent for the session, stating that he is in his home with no one else present; pt shares that he understands the limits of virtual sessions.  I shared with pt that I am in my office with no one else present.     Mental Status Exam: Appearance:  Casual     Behavior: Appropriate  Motor: Normal  Speech/Language:  Clear and Coherent  Affect: Appropriate  Mood: normal  Thought process: normal  Thought content:   WNL  Sensory/Perceptual disturbances:   WNL  Orientation: oriented to person, place, and time/date  Attention: Good  Concentration: Good  Memory: WNL  Fund of knowledge:  Good  Insight:   Good  Judgment:  Good  Impulse Control: Good   Risk Assessment: Danger to Self:  No Self-injurious Behavior: No Danger to Others: No Duty to Warn:no Physical Aggression / Violence:No  Access to Firearms a concern: No  Gang Involvement:No   Subjective: Pt shares he has been "alright since our last session.  My brother just had twins and everyone is doing fine.  Other than that, everything else is going about the same."  Pt shares that he has not been doing many self care activities since our last session, except for listening to music.  "I have not been able to make very much music like I would like to be."  Pt shares that he does have a performance coming up next month and he is looking forward to doing that event.  He also is planning to do a performance at the [employer] in the Fall.  Pt shares that Alexander Morrison (11 mo) has been doing well, she is trying to learn to walk and is trying to talk as well.  Pt shares that his wife is considering changing therapists because she feels that her  current person is not helping her as much as she needs.  Pt shares he may want to go to a park this weekend as a family.  Pt shares that he is planning to reach out to his dad and other people in his life to let them know how much her cares for them.  Alexander Morrison's birthday is [birth year]20 and they will be planning a birthday party for her.  Encouraged pt to continue with his self care activities and we will meet in 2 wks for a follow up session.  Interventions: Cognitive Behavioral Therapy  Diagnosis:Adjustment disorder with depressed mood  Plan: Treatment Plan Strengths/Abilities:  Intelligent, Intuitive, Willing to participate in therapy Treatment Preferences:  Outpatient Individual Therapy Statement of Needs:  Patient is to use CBT, mindfulness and coping skills to help manage and/or decrease symptoms associated with their diagnosis. Symptoms:  Depressed/Irritable mood, worry, social withdrawal Problems Addressed:  Depressive thoughts, Sadness, Sleep issues, etc. Long Term Goals:  Pt to reduce overall level, frequency, and intensity of the feelings of depression as evidenced by decreased irritability, negative self talk, and helpless feelings from 6 to 7 days/week to 0 to 1 days/week, per client report, for at least 3 consecutive months.  Progress: 30% Short Term Goals:  Pt to verbally express understanding of the relationship between feelings of depression and their impact on thinking patterns and behaviors.  Pt to verbalize an understanding of the role that distorted thinking plays in creating fears, excessive worry, and ruminations.  Progress: 30% Target Date:  12/19/2023 Frequency:  Bi-weekly Modality:  Cognitive Behavioral Therapy Interventions by Therapist:  Therapist will use CBT, Mindfulness exercises, Coping skills and Referrals, as needed by client. Client has verbally approved this treatment plan.  Karie Kirks, [facility]

## 2023-01-17 ENCOUNTER — Ambulatory Visit: Payer: No Typology Code available for payment source | Admitting: Psychology

## 2023-01-21 ENCOUNTER — Ambulatory Visit: Payer: No Typology Code available for payment source | Admitting: Neurology

## 2023-01-21 DIAGNOSIS — R0681 Apnea, not elsewhere classified: Secondary | ICD-10-CM

## 2023-01-21 DIAGNOSIS — R0683 Snoring: Secondary | ICD-10-CM

## 2023-01-21 DIAGNOSIS — G4719 Other hypersomnia: Secondary | ICD-10-CM

## 2023-01-21 DIAGNOSIS — Z9189 Other specified personal risk factors, not elsewhere classified: Secondary | ICD-10-CM

## 2023-01-21 DIAGNOSIS — G4733 Obstructive sleep apnea (adult) (pediatric): Secondary | ICD-10-CM

## 2023-01-21 DIAGNOSIS — R519 Headache, unspecified: Secondary | ICD-10-CM

## 2023-01-22 ENCOUNTER — Ambulatory Visit: Payer: No Typology Code available for payment source | Admitting: Psychology

## 2023-01-22 DIAGNOSIS — F4321 Adjustment disorder with depressed mood: Secondary | ICD-10-CM

## 2023-01-22 NOTE — Progress Notes (Addendum)
[facility]al [facility] Counselor/Therapist Progress Note  Patient ID: Alexander Morrison, MRN: [id number],    Date: 01/22/2023  Time Spent: 60 mins  start time: 1300   end time: 1400  Treatment Type: Individual Therapy  Reported Symptoms: Pt presents for session via Caregility video.  He grants consent for the session, stating that he is in his home with no one else present; pt shares that he understands the limits of virtual sessions.  I shared with pt that I am in my office with no one else present.     Mental Status Exam: Appearance:  Casual     Behavior: Appropriate  Motor: Normal  Speech/Language:  Clear and Coherent  Affect: Appropriate  Mood: normal  Thought process: normal  Thought content:   WNL  Sensory/Perceptual disturbances:   WNL  Orientation: oriented to person, place, and time/date  Attention: Good  Concentration: Good  Memory: WNL  Fund of knowledge:  Good  Insight:   Good  Judgment:  Good  Impulse Control: Good   Risk Assessment: Danger to Self:  No Self-injurious Behavior: No Danger to Others: No Duty to Warn:no Physical Aggression / Violence:No  Access to Firearms a concern: No  Gang Involvement:No   Subjective: Pt shares he has been "up and down since our last session.  We are having Junney's first birthday party on Saturday and we still have some stuff to do to get ready for it.  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  Pt also shares that he knows 3-4 people who have various forms of cancer and that is challenging for pt.  Pt shares circumstances that he has encountered lately that have brought his mood down.  We talked about the benefits of a Gratitude List for pt; asked pt to begin  developing a list between now and our follow up session.  Pt shares that [place] is not feeling well this week, she has had a cold.  Pt shares that his brother's twins and their mom are doing fine.  Pt and his wife Lequita Halt) are going to [place] in October to visit pt's step mom and dad and Morgan's grandmother and extended family as well.  Encouraged pt to continue with his self care activities and we will meet in 2 wks for a follow up session.  Interventions: Cognitive Behavioral Therapy  Diagnosis:Adjustment disorder with depressed mood  Plan: Treatment Plan Strengths/Abilities:  Intelligent, Intuitive, Willing to participate in therapy Treatment Preferences:  Outpatient Individual Therapy Statement of Needs:  Patient is to use CBT, mindfulness and coping skills to help manage and/or decrease symptoms associated with their diagnosis. Symptoms:  Depressed/Irritable mood, worry, social withdrawal Problems Addressed:  Depressive thoughts, Sadness, Sleep issues, etc. Long Term Goals:  Pt to reduce overall level, frequency, and intensity of the feelings of depression as evidenced by decreased irritability, negative self talk, and helpless feelings from 6 to 7 days/week to 0 to 1 days/week, per client report, for at least 3 consecutive months.  Progress: 30% Short Term Goals:  Pt to verbally express understanding of the relationship between feelings of depression and their impact on thinking patterns and behaviors.  Pt to verbalize  an understanding of the role that distorted thinking plays in creating fears, excessive worry, and ruminations.  Progress: 30% Target Date:  12/19/2023 Frequency:  Bi-weekly Modality:  Cognitive Behavioral Therapy Interventions by Therapist:  Therapist will use CBT, Mindfulness exercises, Coping skills and Referrals, as needed by client. Client has verbally approved this treatment plan.  Karie Kirks, [facility]

## 2023-01-22 NOTE — Progress Notes (Signed)
See procedure note.

## 2023-01-28 NOTE — Addendum Note (Signed)
Addended by: [name] on: 01/28/2023 05:41 PM   Modules accepted: Orders

## 2023-01-28 NOTE — Procedures (Signed)
[facility]  HOME SLEEP TEST (Watch PAT) REPORT  STUDY DATE: 01/21/23  DOB: November 25, 1985  MRN: [id number]  ORDERING CLINICIAN: [name], MD, PhD   REFERRING CLINICIAN: Jarold Motto, [place]   CLINICAL INFORMATION/HISTORY: 37 year old right-handed gentleman with an underlying medical history of allergies, asthma, reflux disease, elevated blood pressure readings, smoking, and severe obesity with a BMI of over 40, who reports snoring and excessive daytime somnolence.   Epworth sleepiness score: 2/24.  BMI: 43.1 kg/m  FINDINGS:   Sleep Summary:   Total Recording Time (hours, min): 8 hours, 16 min  Total Sleep Time (hours, min):  7 hours, 10 min  Percent REM (%):    34.9%   Respiratory Indices:   Calculated pAHI (per hour):  27.1/hour         REM pAHI:    54.7/hour       NREM pAHI: 12.5/hour  Central pAHI: 0/hour  Oxygen Saturation Statistics:    Oxygen Saturation (%) Mean: 94%   Minimum oxygen saturation (%):                 82%   O2 Saturation Range (%): 82 - 99%    O2 Saturation (minutes) <=88%: 2.4 min  Pulse Rate Statistics:   Pulse Mean (bpm):    72/min    Pulse Range (56 - 133/min)   IMPRESSION: OSA (obstructive sleep apnea), moderate  RECOMMENDATION:  This home sleep test demonstrates moderate obstructive sleep apnea with a total AHI of 27.1/hour and O2 nadir of 82%.  Moderate to loud snoring was detected, fairly consistently throughout the study. Treatment with a positive airway pressure (PAP) device is recommended. The patient will be advised to proceed with an autoPAP titration/trial at home for now. A full night titration study may be considered to optimize treatment settings, monitor proper oxygen saturations and aid with improvement of tolerance and adherence, if needed down the road. Alternative treatment options may include a dental device through dentistry or orthodontics in selected patients or Inspire (hypoglossal nerve  stimulator) in carefully selected patients (meeting inclusion criteria).  Concomitant weight loss is recommended (where clinically appropriate). Please note that untreated obstructive sleep apnea may carry additional perioperative morbidity. Patients with significant obstructive sleep apnea should receive perioperative PAP therapy and the surgeons and particularly the anesthesiologist should be informed of the diagnosis and the severity of the sleep disordered breathing. The patient should be cautioned not to drive, work at heights, or operate dangerous or heavy equipment when tired or sleepy. Review and reiteration of good sleep hygiene measures should be pursued with any patient. Other causes of the patient's symptoms, including circadian rhythm disturbances, an underlying mood disorder, medication effect and/or an underlying medical problem cannot be ruled out based on this test. Clinical correlation is recommended.  The patient and his referring provider will be notified of the test results. The patient will be seen in follow up in sleep clinic at [facility].  I certify that I have reviewed the raw data recording prior to the issuance of this report in accordance with the standards of the American Academy of Sleep Medicine (AASM).    INTERPRETING PHYSICIAN:   [name], MD, PhD Medical Director, [facility]) Diplomat, ABPN (Neurology and Sleep)   [facility] [address] [place], [place] [address] [phone]

## 2023-01-30 ENCOUNTER — Telehealth: Payer: Self-pay | Admitting: *Deleted

## 2023-01-30 NOTE — Telephone Encounter (Signed)
-----   Message from [name] sent at 01/28/2023  5:41 PM EDT ----- Patient referred by PCP, seen by me on 12/30/2022, patient had a HST on 01/21/2023.    Please call and notify the patient that the recent home sleep test showed obstructive sleep apnea in the moderate range. I recommend treatment in the form of autoPAP, which means, that we don't have to bring him in for a sleep study with CPAP, but will let him start using a so called autoPAP machine at home, which is a CPAP-like machine with self-adjusting pressures. We will send the order to a local DME company (of his choice, or as per insurance requirement). The DME representative will fit him with a mask, educate him on how to use the machine, how to put the mask on, etc. I have placed an order in the chart. Please send the order, talk to patient, send report to referring MD. We will need a FU in sleep clinic for 10 weeks post-PAP set up, please arrange that with me or one of our NPs. Also reinforce the need for compliance with treatment. Thanks,   [name], MD, PhD [facility])

## 2023-01-30 NOTE — Telephone Encounter (Signed)
I called and gave the results of the sleep study to pt.  He is ok to proceed with autopap.   DME [facility]. Gave # to follow up on.  Will call if not heard in week.

## 2023-02-07 ENCOUNTER — Ambulatory Visit (INDEPENDENT_AMBULATORY_CARE_PROVIDER_SITE_OTHER): Payer: No Typology Code available for payment source | Admitting: Psychology

## 2023-02-07 DIAGNOSIS — F4321 Adjustment disorder with depressed mood: Secondary | ICD-10-CM | POA: Diagnosis not present

## 2023-02-07 NOTE — Progress Notes (Signed)
[facility] Counselor/Therapist Progress Note  Patient ID: Alexander Morrison, MRN: [id number],    Date: 02/07/2023  Time Spent: 60 mins  start time: 1300   end time: 1400  Treatment Type: Individual Therapy  Reported Symptoms: Pt presents for session via Caregility video.  He grants consent for the session, stating that he is in his home with no one else present; pt shares that he understands the limits of virtual sessions.  I shared with pt that I am in my office with no one else present.     Mental Status Exam: Appearance:  Casual     Behavior: Appropriate  Motor: Normal  Speech/Language:  Clear and Coherent  Affect: Appropriate  Mood: normal  Thought process: normal  Thought content:   WNL  Sensory/Perceptual disturbances:   WNL  Orientation: oriented to person, place, and time/date  Attention: Good  Concentration: Good  Memory: WNL  Fund of knowledge:  Good  Insight:   Good  Judgment:  Good  Impulse Control: Good   Risk Assessment: Danger to Self:  No Self-injurious Behavior: No Danger to Others: No Duty to Warn:no Physical Aggression / Violence:No  Access to Firearms a concern: No  Gang Involvement:No   Subjective: Pt shares he has had "a great Friday!  I got the new job and I am so happy about that!  I will give my 2 wks notice on Monday, will work that week and then will be on vacation the second week and then will start my new job on 02/24/23."  This new company has a [place] office that he can go to, if he wants the office experience but, primarily, it will be a virtual position.  They are going to [place] to visit Morgan's family in the [place] area and pt's father and step mom.  Pt is looking forward to seeing some music friends in [place] when he is there.  Pt shares that Junney's birthday party was fine at the [facility] and it was nice to have family celebrate Threasa Alpha on her first birthday.  Pt shares that one of his friends who had cancer passed away since  our last session and he is sad about that and misses his friend.  He was able to visit his friend before he passed and he knows it was meaningful for pt and for his friend (his friend was only 33 yo).  Encouraged pt to continue with his self care activities and we will meet in 2 wks for a follow up session.  Interventions: Cognitive Behavioral Therapy  Diagnosis:Adjustment disorder with depressed mood  Plan: Treatment Plan Strengths/Abilities:  Intelligent, Intuitive, Willing to participate in therapy Treatment Preferences:  Outpatient Individual Therapy Statement of Needs:  Patient is to use CBT, mindfulness and coping skills to help manage and/or decrease symptoms associated with their diagnosis. Symptoms:  Depressed/Irritable mood, worry, social withdrawal Problems Addressed:  Depressive thoughts, Sadness, Sleep issues, etc. Long Term Goals:  Pt to reduce overall level, frequency, and intensity of the feelings of depression as evidenced by decreased irritability, negative self talk, and helpless feelings from 6 to 7 days/week to 0 to 1 days/week, per client report, for at least 3 consecutive months.  Progress: 30% Short Term Goals:  Pt to verbally express understanding of the relationship between feelings of depression and their impact on thinking patterns and behaviors.  Pt to verbalize an understanding of the role that distorted thinking plays in creating fears, excessive worry, and ruminations.  Progress: 30%  Target Date:  12/19/2023 Frequency:  Bi-weekly Modality:  Cognitive Behavioral Therapy Interventions by Therapist:  Therapist will use CBT, Mindfulness exercises, Coping skills and Referrals, as needed by client. Client has verbally approved this treatment plan.  Karie Kirks, [facility]

## 2023-02-16 ENCOUNTER — Other Ambulatory Visit: Payer: Self-pay | Admitting: Physician Assistant

## 2023-02-17 ENCOUNTER — Ambulatory Visit
Admission: RE | Admit: 2023-02-17 | Discharge: 2023-02-17 | Disposition: A | Discharge status: Home / Self Care | Payer: No Typology Code available for payment source | Source: Ambulatory Visit | Attending: Emergency Medicine | Admitting: Emergency Medicine

## 2023-02-17 VITALS — BP 127/86 | HR 78 | Temp 98.1°F | Resp 16

## 2023-02-17 DIAGNOSIS — M79605 Pain in left leg: Secondary | ICD-10-CM | POA: Diagnosis not present

## 2023-02-17 DIAGNOSIS — R202 Paresthesia of skin: Secondary | ICD-10-CM | POA: Diagnosis not present

## 2023-02-17 MED ORDER — ALBUTEROL SULFATE HFA 108 (90 BASE) MCG/ACT IN AERS: 1.0000 | INHALATION_SPRAY | RESPIRATORY_TRACT | 3 refills | Status: DC | PRN

## 2023-02-17 MED ORDER — DICLOFENAC POTASSIUM 50 MG PO TABS: 50.0000 mg | ORAL_TABLET | Freq: Every day | ORAL | 0 refills | Status: DC | PRN

## 2023-02-17 MED ORDER — CYCLOBENZAPRINE HCL 10 MG PO TABS: 10.0000 mg | ORAL_TABLET | Freq: Two times a day (BID) | ORAL | 0 refills | Status: AC | PRN

## 2023-02-17 NOTE — Discharge Instructions (Signed)
Take home meds as directed. May use heat or ice to back for comfort. May use lidocaine patch or biofreeze for pain. Please follow up with PCP, may need to referral to physical therapy for further back pain management. GO immediately to ER for loss of bowel and bladder,loss of function, saddle numbness, etc.

## 2023-02-17 NOTE — ED Triage Notes (Signed)
Reports left foot strain onset approx 2 wks ago without injury - states "I was just standing too long". Continues with pain to left proximal dorsal foot, but pain is now radiating up into leg. States now has a change in sensation to entire left lower leg up to thigh. Has tried soaking foot in warm water without relief.

## 2023-02-17 NOTE — ED Provider Notes (Signed)
[facility]    CSN: [id number] Arrival date & time: 02/17/23  0948      History   Chief Complaint Chief Complaint  Patient presents with   Leg Pain   Foot Pain   Appt 1000    HPI LAWRNCE Morrison is a 37 y.o. male.   37 year old male pt, Alexander Morrison, presents to urgent care for evaluation of possible left foot strain that occurred 2 weeks prior on 9/29 while standing too long at an event. Pt also has infant son he picks up a lot, also c/o left lower leg feeling numb from left foot to thigh.   PMH: Back pain in past, no meds or additional medical hx reported at present  The history is provided by the patient. No language interpreter was used.    Past Medical History:  Diagnosis Date   Allergy     Patient Active Problem List   Diagnosis Date Noted   Left leg pain 02/17/2023   Paresthesia 02/17/2023   Snoring 10/23/2016   Moderate persistent asthma without complication 10/21/2016   Depression, recurrent (HCC) 10/21/2016   Obesity 10/21/2016   Allergy 10/21/2016    Past Surgical History:  Procedure Laterality Date   APPENDECTOMY     HIP PINNING         Home Medications    Prior to Admission medications   Medication Sig Start Date End Date Taking? Authorizing Provider  cyclobenzaprine (FLEXERIL) 10 MG tablet Take 1 tablet (10 mg total) by mouth 2 (two) times daily as needed for up to 5 days for muscle spasms. 02/17/23 02/22/23 Yes Tylan Briguglio, Para March, NP  diclofenac (CATAFLAM) 50 MG tablet Take 1 tablet (50 mg total) by mouth daily as needed for up to 5 doses. 02/17/23  Yes Jefrey Raburn, Para March, NP  esomeprazole (NEXIUM) 40 MG capsule Take 1 capsule (40 mg total) by mouth daily. 10/30/22  Yes Jarold Motto, PA  acetaminophen (TYLENOL) 325 MG tablet Take 650 mg by mouth every 6 (six) hours as needed.    [provider]  albuterol (VENTOLIN HFA) 108 (90 Base) MCG/ACT inhaler Inhale 1-2 puffs into the lungs every 4 (four) hours as needed for  wheezing or shortness of breath. 02/17/23   Jarold Motto, PA  beclomethasone (QVAR REDIHALER) 40 MCG/ACT inhaler Inhale 2 puffs into the lungs 2 (two) times daily. 10/30/22   Jarold Motto, PA  calcium carbonate (TUMS - DOSED IN MG ELEMENTAL CALCIUM) 500 MG chewable tablet Chew 1 tablet by mouth as needed for indigestion or heartburn.    [provider]  cetirizine (ZYRTEC) 10 MG tablet TAKE 1 TABLET BY MOUTH EVERY DAY 03/31/22   Jarold Motto, PA  fluticasone (FLONASE) 50 MCG/ACT nasal spray SPRAY 1 SPRAY INTO BOTH NOSTRILS DAILY. 06/03/22   Jarold Motto, PA    Family History Family History  Problem Relation Age of Onset   Hypertension Mother    Hypertension Father        no longer on meds   Hypertension Brother    Lymphoma Brother    Alzheimer's disease Maternal Grandmother    Colon cancer Maternal Grandfather 26       late 60's   Hypertension Paternal Grandmother    Heart disease Paternal Grandmother    Diabetes Paternal Grandmother    Diabetes Other    CAD Other    Cancer Other    Stroke Other    Prostate cancer Neg Hx     Social History Social History  Tobacco Use   Smoking status: Former    Current packs/day: 0.00    Types: Cigarettes    Quit date: 09/01/2012    Years since quitting: 10.4   Smokeless tobacco: Never  Vaping Use   Vaping status: Every Day  Substance Use Topics   Alcohol use: Not Currently    Comment: 3 drinks per day x approx 3-4x/wk   Drug use: Yes    Types: Marijuana     Allergies   Shellfish allergy and Iodine   Review of Systems Review of Systems  Musculoskeletal:  Positive for back pain and myalgias.  Skin: Negative.   Neurological:  Positive for numbness. Negative for weakness.  All other systems reviewed and are negative.    Physical Exam Triage Vital Signs ED Triage Vitals  Encounter Vitals Group     BP      Systolic BP Percentile      Diastolic BP Percentile      Pulse      Resp      Temp       Temp src      SpO2      Weight      Height      Head Circumference      Peak Flow      Pain Score      Pain Loc      Pain Education      Exclude from Growth Chart    No data found.  Updated Vital Signs BP 127/86   Pulse 78   Temp 98.1 F (36.7 C) (Oral)   Resp 16   SpO2 95%   Visual Acuity Right Eye Distance:   Left Eye Distance:   Bilateral Distance:    Right Eye Near:   Left Eye Near:    Bilateral Near:     Physical Exam Vitals and nursing note reviewed.  Constitutional:      General: He is not in acute distress.    Appearance: He is well-developed.  HENT:     Head: Normocephalic and atraumatic.  Eyes:     Conjunctiva/sclera: Conjunctivae normal.  Cardiovascular:     Rate and Rhythm: Normal rate and regular rhythm.     Pulses: Normal pulses.     Heart sounds: Normal heart sounds. No murmur heard. Pulmonary:     Effort: Pulmonary effort is normal. No respiratory distress.     Breath sounds: Normal breath sounds.  Abdominal:     Palpations: Abdomen is soft.     Tenderness: There is no abdominal tenderness.  Musculoskeletal:        General: No swelling.     Cervical back: Neck supple.     Lumbar back: Spasms and tenderness present. Negative right straight leg raise test and negative left straight leg raise test.  Skin:    General: Skin is warm and dry.     Capillary Refill: Capillary refill takes less than 2 seconds.  Neurological:     Mental Status: He is alert.     Comments: 5/5, no foot drop  Psychiatric:        Attention and Perception: Attention normal.        Mood and Affect: Mood normal.        Speech: Speech normal.        Behavior: Behavior normal.      UC Treatments / Results  Labs (all labs ordered are listed, but only abnormal results are displayed) Labs Reviewed - No data to  display  EKG   Radiology No results found.  Procedures Procedures (including critical care time)  Medications Ordered in UC Medications - No data to  display  Initial Impression / Assessment and Plan / UC Course  I have reviewed the triage vital signs and the nursing notes.  Pertinent labs & imaging results that were available during my care of the patient were reviewed by me and considered in my medical decision making (see chart for details).    Discussed exam findings and plan of care with patient, strict go to ER precautions given.   Patient verbalized understanding to this provider.  ddx: Low back pain with sciatica, leg pain, paresthesia, neuropathy Final Clinical Impressions(s) / UC Diagnoses   Final diagnoses:  Left leg pain  Paresthesia     Discharge Instructions      Take home meds as directed. May use heat or ice to back for comfort. May use lidocaine patch or biofreeze for pain. Please follow up with PCP, may need to referral to physical therapy for further back pain management. GO immediately to ER for loss of bowel and bladder,loss of function, saddle numbness, etc.      ED Prescriptions     Medication Sig Dispense Auth. Provider   cyclobenzaprine (FLEXERIL) 10 MG tablet Take 1 tablet (10 mg total) by mouth 2 (two) times daily as needed for up to 5 days for muscle spasms. 10 tablet Yola Paradiso, Para March, NP   diclofenac (CATAFLAM) 50 MG tablet Take 1 tablet (50 mg total) by mouth daily as needed for up to 5 doses. 5 tablet Stashia Sia, Para March, NP      PDMP not reviewed this encounter.   Clancy Gourd, NP 02/17/23 1049

## 2023-02-28 ENCOUNTER — Encounter: Payer: Self-pay | Admitting: Physician Assistant

## 2023-02-28 ENCOUNTER — Ambulatory Visit (INDEPENDENT_AMBULATORY_CARE_PROVIDER_SITE_OTHER): Payer: Self-pay | Admitting: Physician Assistant

## 2023-02-28 VITALS — BP 160/100 | HR 82 | Temp 97.7°F | Ht 71.0 in | Wt 313.0 lb

## 2023-02-28 DIAGNOSIS — M79605 Pain in left leg: Secondary | ICD-10-CM

## 2023-02-28 DIAGNOSIS — R202 Paresthesia of skin: Secondary | ICD-10-CM

## 2023-02-28 DIAGNOSIS — R030 Elevated blood-pressure reading, without diagnosis of hypertension: Secondary | ICD-10-CM | POA: Diagnosis not present

## 2023-02-28 NOTE — Patient Instructions (Addendum)
It was great to see you!  I think you are dealing with neck strain and meralgia paresthetica  Look at handouts for further information  Trial mobic 15 mg daily as needed for inflammation x 1 week and then as needed after that (do no take ibuprofen while on this) Next steps are physical therapy and/or Dr Denyse Amass  Keep an eye on your blood pressure and let us know if consistently >140/90  Take care,  Jarold Motto PA-C

## 2023-02-28 NOTE — Progress Notes (Signed)
Alexander Morrison is a 37 y.o. male here for a follow-up of a pre-existing problem.  History of Present Illness:   Chief Complaint  Patient presents with   Leg Pain    Pt c/o left leg pain since 9/29, starts with ankle and radiates to hip.   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  Endorsed left lower leg numbness diffuse from left foot to thigh. DDX include: low back pain w/ sciatica, leg pain, paresthesia, neuropathy.  Instructed to use home medications as directed, heat/ice for comfort, or Lidocaine patch or biofreeze for pain.  Today he follows up as instructed and would like to discuss other possible etiologies.   Describes instance where he was brushing his teeth and he leaned his head back and experienced spontaneous onset of whole body sharp pain for a second along with moderate tingling in his hands and mild tingling in his feet. Then 9/29 he was standing for an extended period of time and and walking afterwards; next day his left ankle hurt, and then experienced the decreased sensation in his left versus right leg.   Endorses diffusion of left foot to lower back, as well as intermittent weakness in his right arm when laying on his left side -  does bend his arms looking at his phone.   States he has started experiencing worsened hip pain as well. Uses 200 - 600 mg Ibuprofen to manage, had to take 800 mg the day he went to UC.  Notes that he did have pins placed in his hips bilaterally when he was 37 y.o. and he does notice them more in freezing cold temperatures - not current temperatures.   Endorses improvement in ankle pain but the lack of sensation has remained the same, and he notices warmth in his calf but not his thigh.  He does wear a belt, but not daily.   Endorses chronic back pain. Also more right-sided neck tension.  Reports  his current activity level: walking dog & with daughter, as well as setting up his in-home gym.  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 Reports he recently stopped taking Ashwaganda, but restarted it to wean himself off after looking up the side effects of cold [place] quitting.   Denies vision changes, numbness/tingling in face, slurred speech, confusion, or balance issues.   Past Medical History:  Diagnosis Date   Allergy     Social History   Tobacco Use   Smoking status: Former    Current packs/day: 0.00    Types: Cigarettes    Quit date: 09/01/2012    Years since quitting: 10.4   Smokeless tobacco: Never  Vaping Use   Vaping status: Every Day  Substance Use Topics   Alcohol use: Not Currently    Comment: 3 drinks per day x approx 3-4x/wk   Drug use: Yes    Types: Marijuana   Past Surgical History:  Procedure Laterality Date   APPENDECTOMY     HIP PINNING Bilateral 2001   Family History  Problem Relation Age of Onset   Hypertension Mother    Hypertension Father        no longer on meds   Hypertension Brother    Lymphoma Brother    Alzheimer's disease Maternal Grandmother    Colon cancer  Maternal Grandfather 63       late 60's   Hypertension Paternal Grandmother    Heart disease Paternal Grandmother    Diabetes Paternal Grandmother    Diabetes Other    CAD Other    Cancer Other    Stroke Other    Prostate cancer Neg Hx    Allergies  Allergen Reactions   Shellfish Allergy Anaphylaxis   Iodine Swelling   Current Medications:   Current Outpatient Medications:    acetaminophen (TYLENOL) 325 MG tablet, Take 650 mg by mouth every 6 (six) hours as needed., Disp: , Rfl:    albuterol (VENTOLIN HFA) 108 (90 Base) MCG/ACT inhaler, Inhale 1-2 puffs into the lungs every 4 (four) hours as needed for wheezing or shortness of breath., Disp: 8.5 each, Rfl: 3   beclomethasone (QVAR REDIHALER) 40 MCG/ACT inhaler, Inhale 2  puffs into the lungs 2 (two) times daily., Disp: 1 each, Rfl: 6   calcium carbonate (TUMS - DOSED IN MG ELEMENTAL CALCIUM) 500 MG chewable tablet, Chew 1 tablet by mouth as needed for indigestion or heartburn., Disp: , Rfl:    cetirizine (ZYRTEC) 10 MG tablet, TAKE 1 TABLET BY MOUTH EVERY DAY, Disp: 90 tablet, Rfl: 0   esomeprazole (NEXIUM) 40 MG capsule, Take 1 capsule (40 mg total) by mouth daily., Disp: 90 capsule, Rfl: 3   fluticasone (FLONASE) 50 MCG/ACT nasal spray, SPRAY 1 SPRAY INTO BOTH NOSTRILS DAILY., Disp: 16 mL, Rfl: 1  Review of Systems:   ROS See pertinent positives and negatives as per the HPI.  Vitals:   Vitals:   02/28/23 1346 02/28/23 1431  BP: (!) 160/100 (!) 160/100  Pulse: 82   Temp: 97.7 F (36.5 C)   TempSrc: Temporal   SpO2: 95%   Weight: (!) 313 lb (142 kg)   Height: 5\' 11"  (1.803 m)      Body mass index is 43.65 kg/m.  Physical Exam:   Physical Exam Vitals and nursing note reviewed.  Constitutional:      Appearance: He is well-developed.  HENT:     Head: Normocephalic.  Eyes:     Conjunctiva/sclera: Conjunctivae normal.     Pupils: Pupils are equal, round, and reactive to light.  Pulmonary:     Effort: Pulmonary effort is normal.  Musculoskeletal:        General: Normal range of motion.     Cervical back: Normal range of motion.     Comments: Tenderness to palpation to right trapezius muscle No tenderness to palpation to spine Normal ROM of left leg and right arm No tenderness to palpation to left leg and right arm  Skin:    General: Skin is warm and dry.  Neurological:     General: No focal deficit present.     Mental Status: He is alert and oriented to person, place, and time.     Cranial Nerves: Cranial nerves 2-12 are intact.     Comments: Grip strength 5/5  Normal perceived sensation to b/l lower extremity   Psychiatric:        Behavior: Behavior normal.        Thought Content: Thought content normal.        Judgment: Judgment  normal.     Assessment and Plan:   Elevated blood pressure reading Above goal today No evidence of end-organ damage on my exam Recommend patient monitor home blood pressure at least a few times weekly Hopefully with CPAP prescription soon, this will provide improvement If home  monitoring shows consistent elevation, or any symptom(s) develop, recommend reach out to [place] for further advice on next steps  Left leg pain No red flags Symptom(s) are consistent with meralgia paresthetica Encouraged weight loss, looser clothes Trial mobic 15 mg daily as needed for inflammation x 1 week and then as needed after that (do no take ibuprofen while on this) Next steps are physical therapy and/or Dr Denyse Amass -- recommend reaching out if no improvement in 1-2 weeks or any worsening  Paresthesia Possible cervical radiculopathy vs ulnar compression vs other Trial mobic 15 mg daily as needed for inflammation x 1 week and then as needed after that (do no take ibuprofen while on this) Next steps are physical therapy and/or Dr Denyse Amass -- recommend reaching out if no improvement in 1-2 weeks or any worsening Exercises for neck strain provided fro 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  I, Jarold Motto, [place], have reviewed all documentation for this visit. The documentation on 02/28/23 for the exam, diagnosis, procedures, and orders are all accurate and complete.  Jarold Motto, PA-C

## 2023-03-03 ENCOUNTER — Ambulatory Visit: Payer: No Typology Code available for payment source | Admitting: Physician Assistant

## 2023-03-28 ENCOUNTER — Ambulatory Visit (INDEPENDENT_AMBULATORY_CARE_PROVIDER_SITE_OTHER): Payer: BC Managed Care – PPO | Admitting: Psychology

## 2023-03-28 DIAGNOSIS — F4321 Adjustment disorder with depressed mood: Secondary | ICD-10-CM | POA: Diagnosis not present

## 2023-03-28 NOTE — Progress Notes (Signed)
[facility] Counselor/Therapist Progress Note  Patient ID: Alexander Morrison, MRN: [id number],    Date: 03/28/2023  Time Spent: 60 mins  start time: 1500   end time: 1600  Treatment Type: Individual Therapy  Reported Symptoms: Pt presents for session via Caregility video.  He grants consent for the session, stating that he is in his home with no one else present; pt shares that he understands the limits of virtual sessions.  I shared with pt that I am in my office with no one else present.     Mental Status Exam: Appearance:  Casual     Behavior: Appropriate  Motor: Normal  Speech/Language:  Clear and Coherent  Affect: Appropriate  Mood: normal  Thought process: normal  Thought content:   WNL  Sensory/Perceptual disturbances:   WNL  Orientation: oriented to person, place, and time/date  Attention: Good  Concentration: Good  Memory: WNL  Fund of knowledge:  Good  Insight:   Good  Judgment:  Good  Impulse Control: Good   Risk Assessment: Danger to Self:  No Self-injurious Behavior: No Danger to Others: No Duty to Warn:no Physical Aggression / Violence:No  Access to Firearms a concern: No  Gang Involvement:No   Subjective: Pt shares, "I have started my new job and it is going well but I have that imposter syndrome, wondering if I have what it takes to do this."  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 Pt shares that he has been having some health issues lately and he is concerned about that; he has seen his PCP and he feels some better about things.  Pt has been walking first thing in the mornings to help with this.  Pt shares that he has a history of panic attacks and his friends told him about deep breathing, grounding exercises, etc.  Pt shares that their trip to [place] was good; they stayed in the town that [place] family lives  in and then they would drive the 40 mins to his dad's home to visit.  Pt shares that "Morgan's family is chill and mine is crazy as shit."  [place] did well on the trip.  Pt shares that he enjoyed taking [place] to places that he enjoys in [place] and places he used to go.  He left his phone in [place] on the way out to [place] and he had to go to [employer] to get it back and he felt like they were being weird with him about it but he did get his phone back.  Lequita Halt is in recovery and she is concerned about her identity as being something other than a mom.  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  Pt and his family are going to do Thanksgiving at his brother's home with their new twins, in [place].  Encouraged pt to continue with his self care activities and we will meet in 2 wks for a follow up session.  Interventions: Cognitive Behavioral Therapy  Diagnosis:Adjustment disorder with depressed mood  Plan: Treatment Plan Strengths/Abilities:  Intelligent, Intuitive, Willing to participate in therapy Treatment Preferences:  Outpatient Individual Therapy Statement of Needs:  Patient is to use CBT, mindfulness and coping skills to help manage and/or decrease symptoms associated with their  diagnosis. Symptoms:  Depressed/Irritable mood, worry, social withdrawal Problems Addressed:  Depressive thoughts, Sadness, Sleep issues, etc. Long Term Goals:  Pt to reduce overall level, frequency, and intensity of the feelings of depression as evidenced by decreased irritability, negative self talk, and helpless feelings from 6 to 7 days/week to 0 to 1 days/week, per client report, for at least 3 consecutive months.  Progress: 30% Short Term Goals:  Pt to verbally express understanding of the relationship between feelings of depression and their impact on thinking patterns and behaviors.  Pt to verbalize an understanding of the role that distorted thinking  plays in creating fears, excessive worry, and ruminations.  Progress: 30% Target Date:  12/19/2023 Frequency:  Bi-weekly Modality:  Cognitive Behavioral Therapy Interventions by Therapist:  Therapist will use CBT, Mindfulness exercises, Coping skills and Referrals, as needed by client. Client has verbally approved this treatment plan.  Karie Kirks, [facility]

## 2023-03-31 NOTE — Telephone Encounter (Addendum)
Had to cancel initial f/u appointment with Alexander Morrison ,NP hasn't heard from [facility] about cpap f/u  . Will send community message this morning to make [facility] aware. Pt aware to call back and reschedule initial cpap appointment after starting cpap

## 2023-04-01 ENCOUNTER — Telehealth: Payer: Self-pay | Admitting: Adult Health

## 2023-04-11 ENCOUNTER — Ambulatory Visit: Payer: BC Managed Care – PPO | Admitting: Psychology

## 2023-04-11 DIAGNOSIS — F4321 Adjustment disorder with depressed mood: Secondary | ICD-10-CM | POA: Diagnosis not present

## 2023-04-11 NOTE — Progress Notes (Signed)
[facility] Counselor/Therapist Progress Note  Patient ID: RUXIN DASILVA, MRN: [id number],    Date: 04/11/2023  Time Spent: 60 mins  start time: 1500   end time: 1600  Treatment Type: Individual Therapy  Reported Symptoms: Pt presents for session via Caregility video.  He grants consent for the session, stating that he is in his home with no one else present; pt shares that he understands the limits of virtual sessions.  I shared with pt that I am in my office with no one else present.     Mental Status Exam: Appearance:  Casual     Behavior: Appropriate  Motor: Normal  Speech/Language:  Clear and Coherent  Affect: Appropriate  Mood: normal  Thought process: normal  Thought content:   WNL  Sensory/Perceptual disturbances:   WNL  Orientation: oriented to person, place, and time/date  Attention: Good  Concentration: Good  Memory: WNL  Fund of knowledge:  Good  Insight:   Good  Judgment:  Good  Impulse Control: Good   Risk Assessment: Danger to Self:  No Self-injurious Behavior: No Danger to Others: No Duty to Warn:no Physical Aggression / Violence:No  Access to Firearms a concern: No  Gang Involvement:No   Subjective: Pt shares, "I am still working my new job and it is going well.  I still have my mentor person who is there to help me and that is nice.  I understand that January through March are our business' really busy time.  I am trying to get ahead this week and next week so that I can relax during the holidays."  Pt is planning to take off the day before and the day after Christmas as well as the day itself.  Pt shares that [name] is teething this week and she has been a little fussy.  Pt shares he has made a list of what he has learned since Juney's birth: he is not sure if it is easier or harder for him to cry; it is harder for him to focus on things (music or work) outside of [place]; pt is getting his new CPAP after the first of the year; pt is having  more thoughts about his life and maybe fearing death and wondering how [place] and Lequita Halt would do without him;  "I am constantly afraid that I cannot be a good parent and a good husband, but not both" (fear of failure); "I have been much more irritable lately, about big and very little things"; "I have noticed myself drinking a lot more than usual" (may be connected to irritability or my recent cold); pt has concerns about his cousin who lived in [place] and who has 4 kids and he has moved to [facility]; and there are other issues with his cousin, including he has charges against him in [place] for beating up his girlfriend.  Talked through this list and began to peel back the layers of his list.  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  Encouraged pt to continue with his self care activities and we will meet in 2 wks for a follow up session.  Interventions: Cognitive Behavioral Therapy  Diagnosis:Adjustment disorder with depressed mood  Plan: Treatment Plan Strengths/Abilities:  Intelligent, Intuitive, Willing to participate in therapy Treatment Preferences:  Outpatient Individual Therapy Statement of Needs:  Patient is to use CBT, mindfulness and coping skills to help manage and/or decrease symptoms associated with their diagnosis.  Symptoms:  Depressed/Irritable mood, worry, social withdrawal Problems Addressed:  Depressive thoughts, Sadness, Sleep issues, etc. Long Term Goals:  Pt to reduce overall level, frequency, and intensity of the feelings of depression as evidenced by decreased irritability, negative self talk, and helpless feelings from 6 to 7 days/week to 0 to 1 days/week, per client report, for at least 3 consecutive months.  Progress: 30% Short Term Goals:  Pt to verbally express understanding of the relationship between feelings of depression and their impact on thinking patterns and behaviors.  Pt to verbalize an understanding of the role that  distorted thinking plays in creating fears, excessive worry, and ruminations.  Progress: 30% Target Date:  12/19/2023 Frequency:  Bi-weekly Modality:  Cognitive Behavioral Therapy Interventions by Therapist:  Therapist will use CBT, Mindfulness exercises, Coping skills and Referrals, as needed by client. Client has verbally approved this treatment plan.  Karie Kirks, [facility]

## 2023-04-25 ENCOUNTER — Ambulatory Visit: Payer: BC Managed Care – PPO | Admitting: Psychology

## 2023-04-25 DIAGNOSIS — F4321 Adjustment disorder with depressed mood: Secondary | ICD-10-CM | POA: Diagnosis not present

## 2023-04-25 NOTE — Progress Notes (Signed)
[facility] Counselor/Therapist Progress Note  Patient ID: DANISH JAHNKE, MRN: [id number],    Date: 04/25/2023  Time Spent: 60 mins  start time: 1000   end time: 1100  Treatment Type: Individual Therapy  Reported Symptoms: Pt presents for session via Caregility video.  He grants consent for the session, stating that he is in his home with no one else present; pt shares that he understands the limits of virtual sessions.  I shared with pt that I am in my office with no one else present.     Mental Status Exam: Appearance:  Casual     Behavior: Appropriate  Motor: Normal  Speech/Language:  Clear and Coherent  Affect: Appropriate  Mood: normal  Thought process: normal  Thought content:   WNL  Sensory/Perceptual disturbances:   WNL  Orientation: oriented to person, place, and time/date  Attention: Good  Concentration: Good  Memory: WNL  Fund of knowledge:  Good  Insight:   Good  Judgment:  Good  Impulse Control: Good   Risk Assessment: Danger to Self:  No Self-injurious Behavior: No Danger to Others: No Duty to Warn:no Physical Aggression / Violence:No  Access to Firearms a concern: No  Gang Involvement:No   Subjective: Pt shares, "I am trying to get ready for Christmas and the shopping is depleting my checking account.  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  [name] (15 months old) and Lequita Halt are doing well; we just celebrated our 5th anniversary last weekend and that was fun."  Pt shares that Lequita Halt has been gaining so weight on her antidepressant and she met with her psychiatrist this morning and shared this concern with him and they are working on fixing that.  Pt talks about their ability to talk with each other and they are both good listeners.  Pt shares that "work is stressful but not bad enough for me to have ever think that I should have stayed with my former employer.  I also  understand that January through March are our business' really busy time.  I am trying to get ahead this week and next week so that I can relax during the holidays."  Pt shares that he has been less irritable this week than he had been the couple of weeks and he is glad for that.  He has been spending more time with [place] and with Lequita Halt and he appreciated that time.  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  Pt shares he continues to sleep pretty well.  Pt shares that they will be going to his brother's family for Christmas Eve and he is looking forward to seeing his extended family.  They will go to see Morgan's mom's on Christmas Day.  Encouraged pt to continue with his self care activities and we will meet in 2 wks for a follow up session.  Interventions: Cognitive Behavioral Therapy  Diagnosis:Adjustment disorder with depressed mood  Plan: Treatment Plan Strengths/Abilities:  Intelligent, Intuitive, Willing to participate in therapy Treatment Preferences:  Outpatient Individual Therapy Statement of Needs:  Patient is to use CBT, mindfulness and coping skills to help manage and/or decrease symptoms associated with their diagnosis. Symptoms:  Depressed/Irritable mood, worry, social withdrawal Problems Addressed:  Depressive thoughts, Sadness, Sleep issues, etc. Long Term Goals:  Pt to reduce overall level, frequency, and intensity  of the feelings of depression as evidenced by decreased irritability, negative self talk, and helpless feelings from 6 to 7 days/week to 0 to 1 days/week, per client report, for at least 3 consecutive months.  Progress: 30% Short Term Goals:  Pt to verbally express understanding of the relationship between feelings of depression and their impact on thinking patterns and behaviors.  Pt to verbalize an understanding of the role that distorted thinking plays in creating fears, excessive worry, and ruminations.  Progress:  30% Target Date:  12/19/2023 Frequency:  Bi-weekly Modality:  Cognitive Behavioral Therapy Interventions by Therapist:  Therapist will use CBT, Mindfulness exercises, Coping skills and Referrals, as needed by client. Client has verbally approved this treatment plan.  Karie Kirks, [facility]

## 2023-05-09 ENCOUNTER — Ambulatory Visit: Payer: BC Managed Care – PPO | Admitting: Psychology

## 2023-05-09 DIAGNOSIS — F4321 Adjustment disorder with depressed mood: Secondary | ICD-10-CM | POA: Diagnosis not present

## 2023-05-09 NOTE — Progress Notes (Signed)
 [facility] Counselor/Therapist Progress Note  Patient ID: Alexander Morrison, MRN: [id number],    Date: 05/09/2023  Time Spent: 60 mins  start time: 1000   end time: 1100  Treatment Type: Individual Therapy  Reported Symptoms: Pt presents for session via Caregility video.  He grants consent for the session, stating that he is in his home with no one else present; pt shares that he understands the limits of virtual sessions.  I shared with pt that I am in my office with no one else present.     Mental Status Exam: Appearance:  Casual     Behavior: Appropriate  Motor: Normal  Speech/Language:  Clear and Coherent  Affect: Appropriate  Mood: normal  Thought process: normal  Thought content:   WNL  Sensory/Perceptual disturbances:   WNL  Orientation: oriented to person, place, and time/date  Attention: Good  Concentration: Good  Memory: WNL  Fund of knowledge:  Good  Insight:   Good  Judgment:  Good  Impulse Control: Good   Risk Assessment: Danger to Self:  No Self-injurious Behavior: No Danger to Others: No Duty to Warn:no Physical Aggression / Violence:No  Access to Firearms a concern: No  Gang Involvement:No   Subjective: Pt shares, We had a nice holiday season.  We went to my brother's for Christmas Eve and I got to meet my new nieces and hold them for the first time.  We then went to Morgan's mom's for Christmas Day and they had a nice day.  Pt shares his mom did not come to his brother's and he missed seeing her but that is the kind of decision his mom makes for holidays.  Pt shares that he actively avoided a person he knows over the holidays because he did not want to engage with what he perceived to be their energy.  He felt this was a good decision for him.  He believes that he is maturing and he enjoys who he is becoming.  He has a performance tomorrow night with two friends and he is looking forward to that.  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  Pt shares that he talked with his aunt in [place] over the holidays and he would like to take Morgan and Junney up there with him to meet his aunt and show them were he used to live.  Pt shares that work is going well; it has not picked up yet and I am just sitting around waiting for it to get busy; I am pleased that I am not worrying about it right now.  Pt shares that he continues to sleep well and enjoys that.  Pt shares that he is setting his New Year's resolutions; one is to try new fruits and vegetables; another is to work more on his music.  He is looking forward to fulfilling both.  He continues spending time with Juney and with [name] and he appreciates that time.  Encouraged pt to continue with his self care activities and we will meet in 2 wks for a follow up session.  Interventions:  Cognitive Behavioral Therapy  Diagnosis:Adjustment disorder with depressed mood  Plan: Treatment Plan Strengths/Abilities:  Intelligent, Intuitive, Willing to participate in therapy Treatment Preferences:  Outpatient Individual Therapy Statement of Needs:  Patient is to use CBT, mindfulness and coping skills to help manage and/or decrease symptoms associated with their diagnosis. Symptoms:  Depressed/Irritable mood, worry, social withdrawal Problems Addressed:  Depressive thoughts, Sadness, Sleep issues, etc. Long Term Goals:  Pt to reduce overall level, frequency, and intensity of the feelings of depression as evidenced by decreased irritability, negative self talk, and helpless feelings from 6 to 7 days/week to 0 to 1 days/week, per client report, for at least 3  consecutive months.  Progress: 30% Short Term Goals:  Pt to verbally express understanding of the relationship between feelings of depression and their impact on thinking patterns and behaviors.  Pt to verbalize an understanding of the role that distorted thinking plays in creating fears, excessive worry, and ruminations.  Progress: 30% Target Date:  12/19/2023 Frequency:  Bi-weekly Modality:  Cognitive Behavioral Therapy Interventions by Therapist:  Therapist will use CBT, Mindfulness exercises, Coping skills and Referrals, as needed by client. Client has verbally approved this treatment plan.  Francis KATHEE Macintosh, [facility]

## 2023-05-22 ENCOUNTER — Ambulatory Visit: Payer: BC Managed Care – PPO | Admitting: Psychology

## 2023-05-22 DIAGNOSIS — F4321 Adjustment disorder with depressed mood: Secondary | ICD-10-CM | POA: Diagnosis not present

## 2023-05-22 NOTE — Progress Notes (Signed)
[facility] Counselor/Therapist Progress Note  Patient ID: Alexander Morrison, MRN: [id number],    Date: 05/22/2023  Time Spent: 60 mins  start time: 1000   end time: 1100  Treatment Type: Individual Therapy  Reported Symptoms: Pt presents for session via Caregility video.  He grants consent for the session, stating that he is in his home with no one else present; pt shares that he understands the limits of virtual sessions.  I shared with pt that I am in my office with no one else present.     Mental Status Exam: Appearance:  Casual     Behavior: Appropriate  Motor: Normal  Speech/Language:  Clear and Coherent  Affect: Appropriate  Mood: normal  Thought process: normal  Thought content:   WNL  Sensory/Perceptual disturbances:   WNL  Orientation: oriented to person, place, and time/date  Attention: Good  Concentration: Good  Memory: WNL  Fund of knowledge:  Good  Insight:   Good  Judgment:  Good  Impulse Control: Good   Risk Assessment: Danger to Self:  No Self-injurious Behavior: No Danger to Others: No Duty to Warn:no Physical Aggression / Violence:No  Access to Firearms a concern: No  Gang Involvement:No   Subjective: Pt shares, "We have all been sick in the past couple of weeks.  Fredrik Rigger got RSV at daycare and she gave it to me and then I ended up giving it to [place].  I was really sick and Lequita Halt is about 3-4 days behind me."  Pt shares that he is glad that he is making better choices for his health; he knows now that he has more to be careful of Fredrik Rigger) and greater reasons to take care of himself.  He shares he had a performance last week and has another one coming up in a couple of weeks.  We talked about the current economic and political situations in our country and how they will all unfold in the next year or two.  Pt understands that it is better for him to stay present with his own situations with his family rather than worrying about the bigger  pictures.  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  He continues spending time with [place] and with Lequita Halt and he appreciates that time.  Encouraged pt to continue with his self care activities and we will meet in 2 wks for a follow up session.  Interventions: Cognitive Behavioral Therapy  Diagnosis:Adjustment disorder with depressed mood  Plan: Treatment Plan Strengths/Abilities:  Intelligent, Intuitive, Willing to participate in therapy Treatment Preferences:  Outpatient Individual Therapy Statement of Needs:  Patient is to use CBT, mindfulness and coping skills to help manage and/or decrease symptoms associated with their diagnosis. Symptoms:  Depressed/Irritable mood, worry, social withdrawal Problems Addressed:  Depressive thoughts, Sadness, Sleep issues, etc. Long Term Goals:  Pt to reduce overall level, frequency, and intensity of the feelings of depression as evidenced by decreased irritability, negative self talk, and helpless feelings from 6 to 7 days/week to 0 to 1 days/week, per client report, for at least 3 consecutive months.  Progress: 30% Short Term Goals:  Pt to verbally express understanding of the relationship between feelings of depression and their impact on thinking patterns and behaviors.  Pt to verbalize an understanding of the role that distorted thinking plays in creating fears, excessive worry, and ruminations.  Progress: 30% Target Date:  12/19/2023 Frequency:  Bi-weekly Modality:  Cognitive Behavioral Therapy Interventions by Therapist:  Therapist will use CBT, Mindfulness exercises, Coping skills and Referrals, as needed by client. Client has verbally approved this treatment plan.  Karie Kirks, [facility]

## 2023-06-05 ENCOUNTER — Encounter: Payer: Self-pay | Admitting: Physician Assistant

## 2023-06-05 ENCOUNTER — Ambulatory Visit: Payer: Self-pay | Admitting: Physician Assistant

## 2023-06-05 ENCOUNTER — Ambulatory Visit: Payer: BC Managed Care – PPO | Admitting: Psychology

## 2023-06-05 VITALS — BP 140/90 | HR 76 | Temp 97.8°F | Ht 71.0 in | Wt 306.5 lb

## 2023-06-05 DIAGNOSIS — F4321 Adjustment disorder with depressed mood: Secondary | ICD-10-CM

## 2023-06-05 DIAGNOSIS — R030 Elevated blood-pressure reading, without diagnosis of hypertension: Secondary | ICD-10-CM

## 2023-06-05 DIAGNOSIS — R0981 Nasal congestion: Secondary | ICD-10-CM | POA: Diagnosis not present

## 2023-06-05 DIAGNOSIS — R519 Headache, unspecified: Secondary | ICD-10-CM | POA: Diagnosis not present

## 2023-06-05 MED ORDER — AMOXICILLIN-POT CLAVULANATE 875-125 MG PO TABS: 1.0000 | ORAL_TABLET | Freq: Two times a day (BID) | ORAL | 0 refills | Status: DC

## 2023-06-05 NOTE — Progress Notes (Signed)
[facility] Counselor/Therapist Progress Note  Patient ID: Alexander Morrison, MRN: [id number],    Date: 06/05/2023  Time Spent: 60 mins  start time: 1000   end time: 1100  Treatment Type: Individual Therapy  Reported Symptoms: Pt presents for session via Caregility video.  He grants consent for the session, stating that he is in his home with no one else present; pt shares that he understands the limits of virtual sessions.  I shared with pt that I am in my office with no one else present.     Mental Status Exam: Appearance:  Casual     Behavior: Appropriate  Motor: Normal  Speech/Language:  Clear and Coherent  Affect: Appropriate  Mood: normal  Thought process: normal  Thought content:   WNL  Sensory/Perceptual disturbances:   WNL  Orientation: oriented to person, place, and time/date  Attention: Good  Concentration: Good  Memory: WNL  Fund of knowledge:  Good  Insight:   Good  Judgment:  Good  Impulse Control: Good   Risk Assessment: Danger to Self:  No Self-injurious Behavior: No Danger to Others: No Duty to Warn:no Physical Aggression / Violence:No  Access to Firearms a concern: No  Gang Involvement:No   Subjective: Pt shares, "I saw my PCP this morning and my BP was high; I probably need to cut back on vaping nicotine and smoking pot daily.  I know I need to get healthier and now is the time for me to do that."  Pt shares that he has been successful stopping smoking at age 38; he stopped for 8 months and felt better.  Pt is planning to be intentional about getting healthier this year.  Pt shares that Lequita Halt is in the process of changing her PCP and other providers; she has been diagnosed with some auto-immune issues recently and she feels good about her new care team.  She has recovered from the RSV she had at our last session.  Pt had another performance last week and he felt good about how he did; he is getting better at using his new equipment and is  enjoying that.  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  Pt understands that it is better for him to stay present with his own situations with his family Lequita Halt and [place]) rather than worrying about the bigger pictures (new political landscape).  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  Encouraged pt to continue with his self care activities and we will meet in 2 wks for a follow up session.  Interventions: Cognitive Behavioral Therapy  Diagnosis:Adjustment disorder with depressed mood  Plan: Treatment Plan Strengths/Abilities:  Intelligent, Intuitive, Willing to participate in therapy Treatment Preferences:  Outpatient Individual Therapy Statement of Needs:  Patient is to use CBT, mindfulness and coping skills to help manage and/or decrease symptoms associated with their diagnosis. Symptoms:  Depressed/Irritable mood, worry, social withdrawal Problems Addressed:  Depressive thoughts, Sadness, Sleep issues, etc. Long Term Goals:  Pt to reduce overall level, frequency, and intensity of the feelings of depression as evidenced by decreased irritability, negative self talk, and helpless feelings from 6 to 7 days/week to 0 to 1 days/week, per client report, for at least 3 consecutive months.  Progress: 30% Short Term Goals:  Pt to  verbally express understanding of the relationship between feelings of depression and their impact on thinking patterns and behaviors.  Pt to verbalize an understanding of the role that distorted thinking plays in creating fears, excessive worry, and ruminations.  Progress: 30% Target Date:  12/19/2023 Frequency:  Bi-weekly Modality:  Cognitive Behavioral Therapy Interventions by Therapist:  Therapist will use CBT, Mindfulness exercises, Coping skills and  Referrals, as needed by client. Client has verbally approved this treatment plan.  Karie Kirks, [facility]

## 2023-06-05 NOTE — Progress Notes (Addendum)
Alexander Morrison is a 38 y.o. male here for a new problem.  History of Present Illness:   Chief Complaint  Patient presents with   Sinus Problem    Pt c/o sinus pressure and nasal pain radiating into head, green/yellow nasal drainage. Also had RSV last week.    HPI  Sinus pain: [name] complains of sinus pressure, nasal pain, and nasal drainage starting 2 weeks ago.  Reports his nasal pain radiates into his head and nasal drainage is green/yellow in color.  Pt notes he had RSV last week and endorses fever, cough, chills, and mucus production when symptoms first started - these sx have improved.  He has been taking flonase, Neti pot and a eucalyptus saline mist which improved his sx in 2 days.  Additionally, he has been taking ibuprofen to manage dull tooth pain (upper left molar).  Has a dentist appointment next week.  Only taking Claritin for seasonal allergies.   Headaches: Reports some headaches, unsure if they are tension headaches or not.  Denies one sided headaches, migraines, and visual changes.  States he usually has had to self-isolate until his symptoms improve.  Of note, pt has an elevated BP today which may contribute to headaches.  Will also get his CPAP soon.    Past Medical History:  Diagnosis Date   Allergy    Social History   Tobacco Use   Smoking status: Former    Current packs/day: 0.00    Types: Cigarettes    Quit date: 09/01/2012    Years since quitting: 10.7   Smokeless tobacco: Never  Vaping Use   Vaping status: Every Day  Substance Use Topics   Alcohol use: Not Currently    Comment: 3 drinks per day x approx 3-4x/wk   Drug use: Yes    Types: Marijuana    Past Surgical History:  Procedure Laterality Date   APPENDECTOMY     HIP PINNING Bilateral 2001    Family History  Problem Relation Age of Onset   Hypertension Mother    Hypertension Father        no longer on meds   Hypertension Brother    Lymphoma Brother    Alzheimer's disease  Maternal Grandmother    Colon cancer Maternal Grandfather 72       late 60's   Hypertension Paternal Grandmother    Heart disease Paternal Grandmother    Diabetes Paternal Grandmother    Diabetes Other    CAD Other    Cancer Other    Stroke Other    Prostate cancer Neg Hx     Allergies  Allergen Reactions   Shellfish Allergy Anaphylaxis   Iodine Swelling    Current Medications:   Current Outpatient Medications:    acetaminophen (TYLENOL) 325 MG tablet, Take 650 mg by mouth every 6 (six) hours as needed., Disp: , Rfl:    albuterol (VENTOLIN HFA) 108 (90 Base) MCG/ACT inhaler, Inhale 1-2 puffs into the lungs every 4 (four) hours as needed for wheezing or shortness of breath., Disp: 8.5 each, Rfl: 3   amoxicillin-clavulanate (AUGMENTIN) 875-125 MG tablet, Take 1 tablet by mouth 2 (two) times daily., Disp: 14 tablet, Rfl: 0   beclomethasone (QVAR REDIHALER) 40 MCG/ACT inhaler, Inhale 2 puffs into the lungs 2 (two) times daily., Disp: 1 each, Rfl: 6   calcium carbonate (TUMS - DOSED IN MG ELEMENTAL CALCIUM) 500 MG chewable tablet, Chew 1 tablet by mouth as needed for indigestion or heartburn., Disp: , Rfl:  cetirizine (ZYRTEC) 10 MG tablet, TAKE 1 TABLET BY MOUTH EVERY DAY, Disp: 90 tablet, Rfl: 0   esomeprazole (NEXIUM) 40 MG capsule, Take 1 capsule (40 mg total) by mouth daily., Disp: 90 capsule, Rfl: 3   fluticasone (FLONASE) 50 MCG/ACT nasal spray, SPRAY 1 SPRAY INTO BOTH NOSTRILS DAILY., Disp: 16 mL, Rfl: 1   Review of Systems:   Negative unless otherwise specified per HPI.  Vitals:   Vitals:   06/05/23 0820  BP: (!) 150/90  Pulse: 76  Temp: 97.8 F (36.6 C)  TempSrc: Temporal  SpO2: 98%  Weight: (!) 306 lb 8 oz (139 kg)  Height: 5\' 11"  (1.803 m)     Body mass index is 42.75 kg/m.  Physical Exam:   Physical Exam Vitals and nursing note reviewed.  Constitutional:      General: He is not in acute distress.    Appearance: He is well-developed. He is not  ill-appearing or toxic-appearing.  HENT:     Nose:     Right Sinus: Frontal sinus tenderness present.     Left Sinus: Frontal sinus tenderness present.  Cardiovascular:     Rate and Rhythm: Normal rate and regular rhythm.     Pulses: Normal pulses.     Heart sounds: Normal heart sounds, S1 normal and S2 normal.  Pulmonary:     Effort: Pulmonary effort is normal.     Breath sounds: Normal breath sounds.  Skin:    General: Skin is warm and dry.  Neurological:     Mental Status: He is alert.     GCS: GCS eye subscore is 4. GCS verbal subscore is 5. GCS motor subscore is 6.  Psychiatric:        Speech: Speech normal.        Behavior: Behavior normal. Behavior is cooperative.     Assessment and Plan:   1. Elevated blood pressure reading (Primary) Above goal today No evidence of end-organ damage on my exam Recommend patient monitor home blood pressure at least a few times weekly Limit frequent ibuprofen If home monitoring shows consistent elevation, or any symptom(s) develop, recommend reach out to [place] for further advice on next steps  2. Sinus congestion No red flags on exam.   Will initiate augmentin per orders.  Discussed taking medications as prescribed.  Reviewed return precautions including new or worsening fever, SOB, new or worsening cough or other concerns.  Push fluids and rest.  I recommend that patient follow-up if symptoms worsen or persist despite treatment x 7-10 days, sooner if needed.  3. Frequent headaches No red flags Suspect related to obstructive sleep apnea, recent illness, possibly hypertension Recommend continued monitoring of symptom(s) and close follow-up if new/worsening symptom(s)   I, Isabelle Course, acting as a Neurosurgeon for Jarold Motto, [place]., have documented all relevant documentation on the behalf of Jarold Motto, [place], as directed by  Jarold Motto, PA while in the presence of Jarold Motto, [place].  I, Jarold Motto, [place], have reviewed all  documentation for this visit. The documentation on 06/05/23 for the exam, diagnosis, procedures, and orders are all accurate and complete.  Jarold Motto, PA-C

## 2023-06-05 NOTE — Patient Instructions (Signed)
It was great to see you!  Start the Augmentin for your sinus infection and dental pain  Your blood pressure is elevated in our office today.  I recommend that you monitor this at home.  Your goal blood pressure should be around < 130/80, unless you are over 36 years old, your goal may be closer to 140-150/90. Please note if you have been given other goals from a cardiologist or other healthcare provider, please defer to their recommendations.  When preparing to take your blood pressure: Plan ahead. Don't smoke, drink caffeine or exercise within 30 minutes before taking your blood pressure. Empty your bladder. Don't take the measurement over clothes. Remove the clothing over the arm that will be used to measure blood pressure. You can use either arm unless otherwise told by a healthcare provider. Usually there is not a big difference between readings on them. Be still. Allow at least five minutes of quiet rest before measurements. Don't talk or use the phone. Sit correctly. Sit with your back straight and supported (on a dining chair, rather than a sofa). Your feet should be flat on the floor. Do not cross your legs. Support your arm on a flat surface. The middle of the cuff should be placed on the upper arm at heart level.  Measure at the same time of the day. Take multiple readings and record the results. Each time you measure, take two readings one minute apart. Record the results and bring in to your next office visit.  In order to know how well the medication is working, I would like you to take your readings 1-2 hours after taking your blood pressure medication if possible. Take your blood pressure measurements and record 2-3 days per week.  If you get a high blood pressure reading: A single high reading is not an immediate cause for alarm. If you get a reading that is higher than normal, take your blood pressure a second time. Write down the results of both measurements. Check with your  health care professional to see if there's a health concern or whether there may be problems with your monitor. If your blood pressure readings are suddenly higher than 180/120 mm Hg, wait at least one minute and test again. If your readings are still very high, contact your health care professional immediately. You could be having a hypertensive crisis. Call 911 if your blood pressure is higher than 180/120 mm Hg and if you are having new signs or symptoms that may include: Chest pain Shortness of breath Back pain Numbness Weakness Change in vision Difficulty speaking Confusion Dizziness Vomiting    Take care,  Jarold Motto PA-C

## 2023-06-09 DIAGNOSIS — G4733 Obstructive sleep apnea (adult) (pediatric): Secondary | ICD-10-CM | POA: Diagnosis not present

## 2023-06-18 ENCOUNTER — Ambulatory Visit: Payer: BC Managed Care – PPO | Admitting: Psychology

## 2023-06-18 DIAGNOSIS — F4321 Adjustment disorder with depressed mood: Secondary | ICD-10-CM | POA: Diagnosis not present

## 2023-06-18 NOTE — Progress Notes (Signed)
[facility] Counselor/Therapist Progress Note  Patient ID: Alexander Morrison, MRN: [id number],    Date: 06/18/2023  Time Spent: 60 mins  start time: 1500   end time: 1600  Treatment Type: Individual Therapy  Reported Symptoms: Pt presents for session via Caregility video.  He grants consent for the session, stating that he is in his home with no one else present; pt shares that he understands the limits of virtual sessions.  I shared with pt that I am in my office with no one else present.     Mental Status Exam: Appearance:  Casual     Behavior: Appropriate  Motor: Normal  Speech/Language:  Clear and Coherent  Affect: Appropriate  Mood: normal  Thought process: normal  Thought content:   WNL  Sensory/Perceptual disturbances:   WNL  Orientation: oriented to person, place, and time/date  Attention: Good  Concentration: Good  Memory: WNL  Fund of knowledge:  Good  Insight:   Good  Judgment:  Good  Impulse Control: Good   Risk Assessment: Danger to Self:  No Self-injurious Behavior: No Danger to Others: No Duty to Warn:no Physical Aggression / Violence:No  Access to Firearms a concern: No  Gang Involvement:No   Subjective: Pt shares, "Work is going pretty well; I have gotten some feedback this week and my boss has told me that I am preforming well; I have gotten pretty close to earning a bonus for this quarter and I still have time to get to where I need to be to earn it."  Pt shares that he tends to over think things and he is aware of that about himself; he tries to stay on top of it when it happens.  Encouraged pt to continue to reason with himself as needed and to give himself credit for doing good work.  Pt shares that [place] is not walking yet; she is taking steps from time to time so pt believes she is right at the cusp. 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  He is working on [employer] for the album and other business  related items for it.  He has also been spending time with [place] and that is meaningful for him.  He also has been spending time with friends since our last session.  He has spent time with his mom last weekend before the Super Bowl and he enjoyed the visit.  Pt has just started using his CPAP machine and he is getting used to it.  The first couple of nights were difficult but he did better with it last night.  Pt shares that his BP is still running high; he knows that his smoking is also not good for his BP so he is planning to use nicotine lozenges to help him cut back.  Pt has a performance on Saturday that he is looking forward to.  Encouraged pt to continue with his self care activities and we will meet in 2 wks for a follow up session.  Interventions: Cognitive Behavioral Therapy  Diagnosis:Adjustment disorder with depressed mood  Plan: Treatment Plan Strengths/Abilities:  Intelligent, Intuitive, Willing to participate in therapy Treatment Preferences:  Outpatient Individual Therapy Statement of Needs:  Patient is to use CBT, mindfulness and coping skills to help manage and/or decrease symptoms associated with their diagnosis. Symptoms:  Depressed/Irritable mood, worry, social withdrawal Problems Addressed:  Depressive thoughts, Sadness, Sleep issues, etc. Long Term Goals:  Pt to reduce overall  level, frequency, and intensity of the feelings of depression as evidenced by decreased irritability, negative self talk, and helpless feelings from 6 to 7 days/week to 0 to 1 days/week, per client report, for at least 3 consecutive months.  Progress: 30% Short Term Goals:  Pt to verbally express understanding of the relationship between feelings of depression and their impact on thinking patterns and behaviors.  Pt to verbalize an understanding of the role that distorted thinking plays in creating fears, excessive worry, and ruminations.  Progress: 30% Target Date:  12/19/2023 Frequency:   Bi-weekly Modality:  Cognitive Behavioral Therapy Interventions by Therapist:  Therapist will use CBT, Mindfulness exercises, Coping skills and Referrals, as needed by client. Client has verbally approved this treatment plan.  Karie Kirks, [facility]

## 2023-06-25 NOTE — Telephone Encounter (Signed)
[facility] Airsense 11 Setup 06/09/23 (appt needed 07/10/23 - 09/07/23)

## 2023-06-30 ENCOUNTER — Telehealth: Payer: Self-pay | Admitting: *Deleted

## 2023-06-30 NOTE — Telephone Encounter (Signed)
 I called pt and LMVM for him to call us about f/u with autopap.   Set up 06-09-2023, insurance compliance appt needed 2-3 months out. 3/6 thru 5/4

## 2023-07-03 ENCOUNTER — Telehealth: Payer: Self-pay | Admitting: Neurology

## 2023-07-03 NOTE — Telephone Encounter (Signed)
 ..  Pt understands that although there may be some limitations with this type of visit, we will take all precautions to reduce any security or privacy concerns.  Pt understands that this will be treated like an in office visit and we will file with pt's insurance, and there may be a patient responsible charge related to this service. ? ?

## 2023-07-03 NOTE — Telephone Encounter (Signed)
 Noted thanks. Patient's data is viewable in [place].

## 2023-07-04 ENCOUNTER — Ambulatory Visit: Payer: BC Managed Care – PPO | Admitting: Psychology

## 2023-07-04 DIAGNOSIS — F4321 Adjustment disorder with depressed mood: Secondary | ICD-10-CM | POA: Diagnosis not present

## 2023-07-04 NOTE — Progress Notes (Signed)
 [facility] Counselor/Therapist Progress Note  Patient ID: SCHUYLER OLDEN, MRN: [id number],    Date: 07/04/2023  Time Spent: 60 mins  start time: 1400   end time: 1500  Treatment Type: Individual Therapy  Reported Symptoms: Pt presents for session via Caregility video.  He grants consent for the session, stating that he is in his home with no one else present; pt shares that he understands the limits of virtual sessions.  I shared with pt that I am in my office with no one else present.     Mental Status Exam: Appearance:  Casual     Behavior: Appropriate  Motor: Normal  Speech/Language:  Clear and Coherent  Affect: Appropriate  Mood: normal  Thought process: normal  Thought content:   WNL  Sensory/Perceptual disturbances:   WNL  Orientation: oriented to person, place, and time/date  Attention: Good  Concentration: Good  Memory: WNL  Fund of knowledge:  Good  Insight:   Good  Judgment:  Good  Impulse Control: Good   Risk Assessment: Danger to Self:  No Self-injurious Behavior: No Danger to Others: No Duty to Warn:no Physical Aggression / Violence:No  Access to Firearms a concern: No  Gang Involvement:No   Subjective: Pt shares, "There is not a lot going on.  I have been failing at stopping some of my vices.  I am still drinking, smoking pot and vaping.  I think I want to try putting the alcohol down first because I think that might be the easiest.  Pt shares that he wants to think through a plan that will work for him.  Pt shares that he has been losing a little bit of weight recently and he is happy about that.  Pt shares they are trying to switch Junney from formula in a bottle at bedtime to a cup of milk.  Pt shares that he has been dealing with a lot of internal dialog about how he is going to lose it all at some point.  He knows that it will not likely happen but it is still a troubling thought for him.  He is trying to use his tools to deal with the  feelings.  Talked with pt about his desire to "let things go that are no longer working for me."  Talked with pt about how to go through this process of letter go of things that he wants to let go of.  Pt also shares that he is troubled by the current political climate in the [place].  His dad (77 yo) recently fell at work and is OK but he was bruised.  Encouraged pt to continue with his self care activities and we will meet in 2 wks for a follow up session.  Interventions: Cognitive Behavioral Therapy  Diagnosis:Adjustment disorder with depressed mood  Plan: Treatment Plan Strengths/Abilities:  Intelligent, Intuitive, Willing to participate in therapy Treatment Preferences:  Outpatient Individual Therapy Statement of Needs:  Patient is to use CBT, mindfulness and coping skills to help manage and/or decrease symptoms associated with their diagnosis. Symptoms:  Depressed/Irritable mood, worry, social withdrawal Problems Addressed:  Depressive thoughts, Sadness, Sleep issues, etc. Long Term Goals:  Pt to reduce overall level, frequency, and intensity of the feelings of depression as evidenced by decreased irritability, negative self talk, and helpless feelings from 6 to 7 days/week to 0 to 1 days/week, per client report, for at least 3 consecutive months.  Progress: 30% Short Term Goals:  Pt to verbally express understanding  of the relationship between feelings of depression and their impact on thinking patterns and behaviors.  Pt to verbalize an understanding of the role that distorted thinking plays in creating fears, excessive worry, and ruminations.  Progress: 30% Target Date:  12/19/2023 Frequency:  Bi-weekly Modality:  Cognitive Behavioral Therapy Interventions by Therapist:  Therapist will use CBT, Mindfulness exercises, Coping skills and Referrals, as needed by client. Client has verbally approved this treatment plan.  Karie Kirks, [facility]

## 2023-07-07 DIAGNOSIS — G4733 Obstructive sleep apnea (adult) (pediatric): Secondary | ICD-10-CM | POA: Diagnosis not present

## 2023-07-16 ENCOUNTER — Telehealth: Payer: BC Managed Care – PPO | Admitting: Adult Health

## 2023-07-16 ENCOUNTER — Telehealth: Payer: Self-pay

## 2023-07-16 ENCOUNTER — Other Ambulatory Visit: Payer: Self-pay | Admitting: Physician Assistant

## 2023-07-16 MED ORDER — CETIRIZINE HCL 10 MG PO TABS: 10.0000 mg | ORAL_TABLET | Freq: Every day | ORAL | 1 refills | Status: DC

## 2023-07-16 NOTE — Telephone Encounter (Signed)
 Called pt yesterday to be reschedule due to him not been compliance with his CPAP machine. Pt verbalized understanding.Pt had no questions at this time but was encouraged to call back if questions arise.

## 2023-07-25 ENCOUNTER — Ambulatory Visit: Admitting: Psychology

## 2023-07-25 DIAGNOSIS — F4321 Adjustment disorder with depressed mood: Secondary | ICD-10-CM

## 2023-07-25 NOTE — Progress Notes (Signed)
 [facility] Counselor/Therapist Progress Note  Patient ID: Alexander Morrison, MRN: [id number],    Date: 07/25/2023  Time Spent: 60 mins  start time: 1400   end time: 1500  Treatment Type: Individual Therapy  Reported Symptoms: Pt presents for session via Caregility video.  He grants consent for the session, stating that he is in his home with no one else present; pt shares that he understands the limits of virtual sessions.  I shared with pt that I am in my office with no one else present.     Mental Status Exam: Appearance:  Casual     Behavior: Appropriate  Motor: Normal  Speech/Language:  Clear and Coherent  Affect: Appropriate  Mood: normal  Thought process: normal  Thought content:   WNL  Sensory/Perceptual disturbances:   WNL  Orientation: oriented to person, place, and time/date  Attention: Good  Concentration: Good  Memory: WNL  Fund of knowledge:  Good  Insight:   Good  Judgment:  Good  Impulse Control: Good   Risk Assessment: Danger to Self:  No Self-injurious Behavior: No Danger to Others: No Duty to Warn:no Physical Aggression / Violence:No  Access to Firearms a concern: No  Gang Involvement:No   Subjective: Pt shares, "Threasa Alpha is finally healthy but I am beginning to experience allergies to this Spring.  Everything has been pretty good.  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  Pt shares that he and Lequita Halt took a day off this week (Wed) and spent time together.  Pt mentions again that he is consistently worried about losing his job.  His worries are normally not well founded.  Pt again mentions that he is not pleased with the current administration and how they are going about their work.  Pt shares that he has jury duty in  early April and is not looking forward to being their.  He is planning to take a book with him that day.  Pt shares that he has reduced some of his alcohol use; he is trying to eat out less and is trying to move around more.  He has lost about 15 lbs since Dec and feels good about that.  He is hoping to join the new gym at [facility] because they have a pool and he enjoys swimming.  He continues to use his CPAP and is sleeping better with it.  Pt shares that Lequita Halt is doing pretty well; she has a new psychiatrist and therapist and has been switched to Prozac and it seems to be working well for her.  Pt also shares that he is troubled by the current political climate in the [place].  Encouraged pt to continue with his self care activities and we will meet in 6 wks for a follow up session, due to my vacation schedule.  Interventions: Cognitive Behavioral Therapy  Diagnosis:Adjustment disorder with depressed mood  Plan: Treatment Plan Strengths/Abilities:  Intelligent, Intuitive, Willing to participate in therapy Treatment Preferences:  Outpatient Individual Therapy Statement of Needs:  Patient is to use CBT, mindfulness and coping skills to help manage and/or decrease symptoms associated with their diagnosis. Symptoms:  Depressed/Irritable mood, worry, social withdrawal Problems Addressed:  Depressive thoughts, Sadness, Sleep issues, etc. Long  Term Goals:  Pt to reduce overall level, frequency, and intensity of the feelings of depression as evidenced by decreased irritability, negative self talk, and helpless feelings from 6 to 7 days/week to 0 to 1 days/week, per client report, for at least 3 consecutive months.  Progress: 30% Short Term Goals:  Pt to verbally express understanding of the relationship between feelings of depression and their impact on thinking patterns and behaviors.  Pt to verbalize an understanding of the role that distorted thinking plays in creating fears, excessive worry, and ruminations.   Progress: 30% Target Date:  12/19/2023 Frequency:  Bi-weekly Modality:  Cognitive Behavioral Therapy Interventions by Therapist:  Therapist will use CBT, Mindfulness exercises, Coping skills and Referrals, as needed by client. Client has verbally approved this treatment plan.  Karie Kirks, [facility]

## 2023-08-07 DIAGNOSIS — G4733 Obstructive sleep apnea (adult) (pediatric): Secondary | ICD-10-CM | POA: Diagnosis not present

## 2023-08-15 ENCOUNTER — Telehealth: Admitting: Adult Health

## 2023-08-25 NOTE — Progress Notes (Deleted)
 Alexander Morrison

## 2023-08-26 ENCOUNTER — Telehealth: Payer: Self-pay

## 2023-08-26 ENCOUNTER — Telehealth: Admitting: Adult Health

## 2023-08-26 NOTE — Telephone Encounter (Signed)
 Please see MyChart.

## 2023-09-12 ENCOUNTER — Ambulatory Visit (INDEPENDENT_AMBULATORY_CARE_PROVIDER_SITE_OTHER): Admitting: Psychology

## 2023-09-12 DIAGNOSIS — F4321 Adjustment disorder with depressed mood: Secondary | ICD-10-CM

## 2023-09-12 NOTE — Progress Notes (Signed)
 [facility] Counselor/Therapist Progress Note  Patient ID: Alexander Morrison, MRN: [id number],    Date: 09/12/2023  Time Spent: 60 mins  start time: 1400   end time: 1500  Treatment Type: Individual Therapy  Reported Symptoms: Pt presents for session via Caregility video.  He grants consent for the session, stating that he is in his home with no one else present; pt shares that he understands the limits of virtual sessions.  I shared with pt that I am in my office with no one else present.     Mental Status Exam: Appearance:  Casual     Behavior: Appropriate  Motor: Normal  Speech/Language:  Clear and Coherent  Affect: Appropriate  Mood: normal  Thought process: normal  Thought content:   WNL  Sensory/Perceptual disturbances:   WNL  Orientation: oriented to person, place, and time/date  Attention: Good  Concentration: Good  Memory: WNL  Fund of knowledge:  Good  Insight:   Good  Judgment:  Good  Impulse Control: Good   Risk Assessment: Danger to Self:  No Self-injurious Behavior: No Danger to Others: No Duty to Warn:no Physical Aggression / Violence:No  Access to Firearms a concern: No  Gang Involvement:No   Subjective: Pt shares, "Alexander Morrison is growing and going through developmental stuff.  She is in her phase of not liking 'dad'."  Talked through parenting topics with pt.  Pt shares that work is going fine; "I am about to get my first earned bonus next week and that feels good."  Pt shares that he has a couple of coworkers that are not great partners and have "thrown me under the bus lately."  Overall, work is going well for him; they had a rush of new clients a couple of weeks ago and he has worked through that.  He has been cleaning up the house and has started his garden.  He has been performing more lately both here and in [place].  Alexander Morrison has been going with him and they dropped Alexander Morrison off at his mom's and they got a hotel room for the night.  He has gotten  flowers for his mom and Alexander Morrison and has cards for them; he also got Alexander Morrison a new necklace with her's and Junney's birthdays on it.  Pt has a concealed carry class tomorrow; he owns a handgun and wants to practice his shooting more.  Pt shares that his nephew (8th grade) had his MS shut down yesterday because a kid got beat up and pulled out a gun; nephew was never in danger.  Pt shares he has started reading the book, "Letting Go" by Dean Every and he wants to be more able to let go of things that are difficult for him.  He wants to give himself more grace but he also wants to hold himself accountable as well.  Encouraged pt to continue with his self care activities and we will meet in 2 wks for a follow up session.  Interventions: Cognitive Behavioral Therapy  Diagnosis:Adjustment disorder with depressed mood  Plan: Treatment Plan Strengths/Abilities:  Intelligent, Intuitive, Willing to participate in therapy Treatment Preferences:  Outpatient Individual Therapy Statement of Needs:  Patient is to use CBT, mindfulness and coping skills to help manage and/or decrease symptoms associated with their diagnosis. Symptoms:  Depressed/Irritable mood, worry, social withdrawal Problems Addressed:  Depressive thoughts, Sadness, Sleep issues, etc. Long Term Goals:  Pt to reduce overall level, frequency, and intensity of the feelings of depression as evidenced by  decreased irritability, negative self talk, and helpless feelings from 6 to 7 days/week to 0 to 1 days/week, per client report, for at least 3 consecutive months.  Progress: 30% Short Term Goals:  Pt to verbally express understanding of the relationship between feelings of depression and their impact on thinking patterns and behaviors.  Pt to verbalize an understanding of the role that distorted thinking plays in creating fears, excessive worry, and ruminations.  Progress: 30% Target Date:  12/19/2023 Frequency:  Bi-weekly Modality:  Cognitive  Behavioral Therapy Interventions by Therapist:  Therapist will use CBT, Mindfulness exercises, Coping skills and Referrals, as needed by client. Client has verbally approved this treatment plan.  [name], [facility]

## 2023-09-26 ENCOUNTER — Ambulatory Visit: Admitting: Psychology

## 2023-09-26 DIAGNOSIS — F4321 Adjustment disorder with depressed mood: Secondary | ICD-10-CM

## 2023-09-26 NOTE — Progress Notes (Signed)
 [facility] Counselor/Therapist Progress Note  Patient ID: Alexander Morrison, MRN: [id number],    Date: 09/26/2023  Time Spent: 60 mins  start time: 1400   end time: 1500  Treatment Type: Individual Therapy  Reported Symptoms: Pt presents for session via Caregility video.  He grants consent for the session, stating that he is in his home with no one else present; pt shares that he understands the limits of virtual sessions.  I shared with pt that I am in my office with no one else present.     Mental Status Exam: Appearance:  Casual     Behavior: Appropriate  Motor: Normal  Speech/Language:  Clear and Coherent  Affect: Appropriate  Mood: normal  Thought process: normal  Thought content:   WNL  Sensory/Perceptual disturbances:   WNL  Orientation: oriented to person, place, and time/date  Attention: Good  Concentration: Good  Memory: WNL  Fund of knowledge:  Good  Insight:   Good  Judgment:  Good  Impulse Control: Good   Risk Assessment: Danger to Self:  No Self-injurious Behavior: No Danger to Others: No Duty to Warn:no Physical Aggression / Violence:No  Access to Firearms a concern: No  Gang Involvement:No   Subjective: Pt shares, "Junney'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  I took [place] to the park this morning and we went to [employer] too."  Pt shares that work has been going well; this week was really slow and that was fine for him.  He got his first bonus check and bought new tires, got his alignment fixed and then had a little bit of fun.  Pt shares that work is going well.  He and Britta Candy are doing well; they are going to [place] next weekend for another performance of his; Laurice Pope will be spending time with her grandmother.  Britta Candy is doing well at work too.  Pt shares that he has not been paying too much attention to the current political situation.  He is unhappy with headlines and executive orders and has  friends who are being effected by them and is trying to support them.  Pt shares that Britta Candy and his mom appreciated their Mother's Day gifts.  Pt completed his concealed carry class and did well on the shooting portion.  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  Encouraged pt to continue with his self care activities and we will meet in 2 wks for a follow up session.  Interventions: Cognitive Behavioral Therapy  Diagnosis:Adjustment disorder with depressed mood  Plan: Treatment Plan Strengths/Abilities:  Intelligent, Intuitive, Willing to participate in therapy Treatment Preferences:  Outpatient Individual Therapy Statement of Needs:  Patient is to use CBT, mindfulness and coping skills to help manage and/or decrease symptoms associated with their diagnosis. Symptoms:  Depressed/Irritable mood, worry, social withdrawal Problems Addressed:  Depressive thoughts, Sadness, Sleep issues, etc. Long Term Goals:  Pt to reduce overall level, frequency, and intensity of the feelings of depression as evidenced by decreased irritability, negative self talk, and helpless feelings from 6 to 7 days/week to 0 to 1 days/week, per client report, for at least 3 consecutive months.  Progress: 30% Short Term Goals:  Pt to verbally express understanding of the relationship between feelings of depression and their impact on thinking patterns and behaviors.  Pt to verbalize an understanding of the role  that distorted thinking plays in creating fears, excessive worry, and ruminations.  Progress: 30% Target Date:  12/19/2023 Frequency:  Bi-weekly Modality:  Cognitive Behavioral Therapy Interventions by Therapist:  Therapist will use CBT, Mindfulness exercises, Coping skills and Referrals, as needed by client. Client has verbally approved this treatment plan.  [name], [facility]

## 2023-10-07 ENCOUNTER — Encounter: Payer: Self-pay | Admitting: Physician Assistant

## 2023-10-07 ENCOUNTER — Ambulatory Visit: Admitting: Physician Assistant

## 2023-10-07 VITALS — BP 150/100 | HR 74 | Ht 71.0 in | Wt 291.4 lb

## 2023-10-07 DIAGNOSIS — R519 Headache, unspecified: Secondary | ICD-10-CM

## 2023-10-07 DIAGNOSIS — F419 Anxiety disorder, unspecified: Secondary | ICD-10-CM | POA: Diagnosis not present

## 2023-10-07 DIAGNOSIS — F32A Depression, unspecified: Secondary | ICD-10-CM

## 2023-10-07 DIAGNOSIS — I10 Essential (primary) hypertension: Secondary | ICD-10-CM

## 2023-10-07 DIAGNOSIS — E88819 Insulin resistance, unspecified: Secondary | ICD-10-CM

## 2023-10-07 LAB — CBC WITH DIFFERENTIAL/PLATELET
Basophils Absolute: 0 10*3/uL (ref 0.0–0.1)
Basophils Relative: 0.9 % (ref 0.0–3.0)
Eosinophils Absolute: 0.1 10*3/uL (ref 0.0–0.7)
Eosinophils Relative: 1.6 % (ref 0.0–5.0)
HCT: 42.9 % (ref 39.0–52.0)
Hemoglobin: 14 g/dL (ref 13.0–17.0)
Lymphocytes Relative: 49.9 % — ABNORMAL HIGH (ref 12.0–46.0)
Lymphs Abs: 1.9 10*3/uL (ref 0.7–4.0)
MCHC: 32.7 g/dL (ref 30.0–36.0)
MCV: 85.8 fl (ref 78.0–100.0)
Monocytes Absolute: 0.2 10*3/uL (ref 0.1–1.0)
Monocytes Relative: 6.2 % (ref 3.0–12.0)
Neutro Abs: 1.6 10*3/uL (ref 1.4–7.7)
Neutrophils Relative %: 41.4 % — ABNORMAL LOW (ref 43.0–77.0)
Platelets: 288 10*3/uL (ref 150.0–400.0)
RBC: 5 Mil/uL (ref 4.22–5.81)
RDW: 13.8 % (ref 11.5–15.5)
WBC: 3.8 10*3/uL — ABNORMAL LOW (ref 4.0–10.5)

## 2023-10-07 LAB — LIPID PANEL
Cholesterol: 163 mg/dL (ref 0–200)
HDL: 54.5 mg/dL (ref >39.00)
LDL Cholesterol: 79 mg/dL (ref 0–99)
NonHDL: 108.9
Total CHOL/HDL Ratio: 3
Triglycerides: 151 mg/dL — ABNORMAL HIGH (ref 0.0–149.0)
VLDL: 30.2 mg/dL (ref 0.0–40.0)

## 2023-10-07 LAB — COMPREHENSIVE METABOLIC PANEL WITH GFR
ALT: 35 U/L (ref 0–53)
AST: 27 U/L (ref 0–37)
Albumin: 4.5 g/dL (ref 3.5–5.2)
Alkaline Phosphatase: 66 U/L (ref 39–117)
BUN: 12 mg/dL (ref 6–23)
CO2: 27 meq/L (ref 19–32)
Calcium: 9.8 mg/dL (ref 8.4–10.5)
Chloride: 103 meq/L (ref 96–112)
Creatinine, Ser: 1.03 mg/dL (ref 0.40–1.50)
GFR: 93.81 mL/min (ref >60.00)
Glucose, Bld: 96 mg/dL (ref 70–99)
Potassium: 4.2 meq/L (ref 3.5–5.1)
Sodium: 137 meq/L (ref 135–145)
Total Bilirubin: 0.3 mg/dL (ref 0.2–1.2)
Total Protein: 7.8 g/dL (ref 6.0–8.3)

## 2023-10-07 LAB — TSH: TSH: 1.87 u[IU]/mL (ref 0.35–5.50)

## 2023-10-07 LAB — HEMOGLOBIN A1C: Hgb A1c MFr Bld: 5.8 % (ref 4.6–6.5)

## 2023-10-07 MED ORDER — VALSARTAN 40 MG PO TABS
40.0000 mg | ORAL_TABLET | Freq: Every day | ORAL | 3 refills | Status: DC
Start: 2023-10-07 — End: 2023-11-12

## 2023-10-07 MED ORDER — BUSPIRONE HCL 15 MG PO TABS: 15.0000 mg | ORAL_TABLET | Freq: Three times a day (TID) | ORAL | 1 refills | Status: DC | PRN

## 2023-10-07 NOTE — Patient Instructions (Addendum)
 It was great to see you!  -Stop potassium -Start valsartan 40 mg daily -Trial buspar 15 mg three times daily as needed (start with 5 mg)  1-[facility]  ** (I'll place referral here) Address: [address] [place], [place] [address] Phone: [phone]  [facility]   [address], [place], [place] [address] Phone: [phone]  3- Shirlyn Dowdy, [facility] Address: [address] [place], [place] [address] Phone: [phone]  Let's follow-up in 1 month, sooner if you have concerns.  Take care,  Alexander Iba PA-C

## 2023-10-07 NOTE — Progress Notes (Signed)
 Alexander Morrison is a 38 y.o. male here for a new problem.  History of Present Illness:   Chief Complaint  Patient presents with   Anxiety    Pt had panic attack over the weekend.   Elevated blood pressure reading   Anxiety: Pt reports increased anxiety and irritability and 2 panic attacks in the past 2 weeks.  Had 2 panic attacks over the weekend, on Friday 5/30 and Sunday 6/1. He adds that he has not had episodes this intense in several years.  Pt notes that in the last 5-6 months he has been doing gigs every weekend causing him to stay up late and have unhealthy eating habits.  He is established with behavioral health and follows up regularly.  Started exercising more regularly and walking at least 30 minutes daily or strength training with 10 lb kettle bell.  Cut back on alcohol consumption; tries to limit himself to celebratory drinks rather than daily drinking.  Has not had a drink in 2 days.  Has lost about 22 lbs since 02/2023. He has been working on smoking cessation but does not have desire to fully stop, struggles with this after several yrs of smoking since early adulthood.  In the past he has stopped smoking for up to 6 months.  Has been on Celexa  and Zoloft  in the past; has not yet tried Buspar.   Hypertension: Pt reports recent elevated BP readings.  Endorses headaches which he attributes to uncontrolled HTN.  Pt is agreeable to starting antihypertensive treatment.  He has been working on exercise, healthy diet, and increasing water intake.  Pt currently takes multiple supplements: vitamin K, vitamin D, and fish oil.   He was previously compliant with CPAP, not using nightly. States he has to clean/replace the CPAP tube and has been using breath strips in the meantime.  BP Readings from Last 3 Encounters:  10/07/23 (!) 150/100  06/05/23 (!) 140/90  02/28/23 (!) 160/100    Past Medical History:  Diagnosis Date   Allergy      Social History   Tobacco Use    Smoking status: Former    Current packs/day: 0.00    Types: Cigarettes    Quit date: 09/01/2012    Years since quitting: 11.1   Smokeless tobacco: Never  Vaping Use   Vaping status: Every Day  Substance Use Topics   Alcohol use: Not Currently    Comment: 3 drinks per day x approx 3-4x/wk   Drug use: Yes    Types: Marijuana    Past Surgical History:  Procedure Laterality Date   APPENDECTOMY     HIP PINNING Bilateral 2001    Family History  Problem Relation Age of Onset   Hypertension Mother    Hypertension Father        no longer on meds   Hypertension Brother    Lymphoma Brother    Alzheimer's disease Maternal Grandmother    Colon cancer Maternal Grandfather 5       late 60's   Hypertension Paternal Grandmother    Heart disease Paternal Grandmother    Diabetes Paternal Grandmother    Diabetes Other    CAD Other    Cancer Other    Stroke Other    Prostate cancer Neg Hx     Allergies  Allergen Reactions   Shellfish Allergy Anaphylaxis   Iodine Swelling    Current Medications:   Current Outpatient Medications:    acetaminophen (TYLENOL) 325 MG tablet, Take 650 mg  by mouth every 6 (six) hours as needed., Disp: , Rfl:    albuterol  (VENTOLIN  HFA) 108 (90 Base) MCG/ACT inhaler, Inhale 1-2 puffs into the lungs every 4 (four) hours as needed for wheezing or shortness of breath., Disp: 8.5 each, Rfl: 3   beclomethasone (QVAR  REDIHALER) 40 MCG/ACT inhaler, Inhale 2 puffs into the lungs 2 (two) times daily., Disp: 1 each, Rfl: 6   busPIRone (BUSPAR) 15 MG tablet, Take 1 tablet (15 mg total) by mouth 3 (three) times daily as needed (anxiety)., Disp: 30 tablet, Rfl: 1   calcium carbonate (TUMS - DOSED IN MG ELEMENTAL CALCIUM) 500 MG chewable tablet, Chew 1 tablet by mouth as needed for indigestion or heartburn., Disp: , Rfl:    cetirizine  (ZYRTEC ) 10 MG tablet, Take 1 tablet (10 mg total) by mouth daily., Disp: 90 tablet, Rfl: 1   esomeprazole  (NEXIUM ) 40 MG capsule, Take 1  capsule (40 mg total) by mouth daily., Disp: 90 capsule, Rfl: 3   fluticasone  (FLONASE ) 50 MCG/ACT nasal spray, SPRAY 1 SPRAY INTO BOTH NOSTRILS DAILY., Disp: 16 mL, Rfl: 1   valsartan (DIOVAN) 40 MG tablet, Take 1 tablet (40 mg total) by mouth daily., Disp: 90 tablet, Rfl: 3   Review of Systems:   Negative unless otherwise specified per HPI.  Vitals:   Vitals:   10/07/23 1108 10/07/23 1157  BP: (!) 160/100 (!) 150/100  Pulse: 74   SpO2: 96%   Weight: 291 lb 6.1 oz (132.2 kg)   Height: 5\' 11"  (1.803 m)      Body mass index is 40.64 kg/m.  Physical Exam:   Physical Exam Vitals and nursing note reviewed.  Constitutional:      General: He is not in acute distress.    Appearance: He is well-developed. He is not ill-appearing or toxic-appearing.  Cardiovascular:     Rate and Rhythm: Normal rate and regular rhythm.     Pulses: Normal pulses.     Heart sounds: Normal heart sounds, S1 normal and S2 normal.  Pulmonary:     Effort: Pulmonary effort is normal.     Breath sounds: Normal breath sounds.  Skin:    General: Skin is warm and dry.  Neurological:     Mental Status: He is alert.     GCS: GCS eye subscore is 4. GCS verbal subscore is 5. GCS motor subscore is 6.  Psychiatric:        Speech: Speech normal.        Behavior: Behavior normal. Behavior is cooperative.     Assessment and Plan:   1. Anxiety and depression (Primary) Uncontrolled Start buspar 5-15 mg three times daily as needed Discussed risks and benefits/side effect(s) of medication Follow up in 1 month to assess, sooner if concerns Continue counseling efforts I discussed with patient that if they develop any SI, to tell someone immediately and seek medical attention. - CBC with Differential/Platelet - Comprehensive metabolic panel with GFR - TSH  2. Primary hypertension Above goal today No evidence of end-organ damage on my exam Recommend patient monitor home blood pressure at least a few times  weekly Start valsartan 40 mg daily If home monitoring shows consistent elevation, or any symptom(s) develop, recommend reach out to us  for further advice on next steps - Lipid panel  3. Frequent headaches No red flags Suspect due to anxiety and HTN Recommend blood pressure medication as we discussed Continue efforts to decrease alcohol and slowly increase exercise Push fluids Update blood work today  Encouraged resumption of CPAP  If worsens, reach out Follow up in 1 month   4. Insulin resistance - Hemoglobin A1c   Update Hemoglobin A1c and provide recommendations   I, Bernita Bristle, acting as a Neurosurgeon for Alexander Iba, [place]., have documented all relevant documentation on the behalf of Alexander Iba, [place], as directed by   while in the presence of Alexander Iba, [place].  I, Alexander Iba, [place], have reviewed all documentation for this visit. The documentation on 10/07/23 for the exam, diagnosis, procedures, and orders are all accurate and complete.  Alexander Iba, PA-C

## 2023-10-08 ENCOUNTER — Ambulatory Visit: Payer: Self-pay | Admitting: Physician Assistant

## 2023-10-10 ENCOUNTER — Ambulatory Visit: Admitting: Psychology

## 2023-10-10 DIAGNOSIS — F4321 Adjustment disorder with depressed mood: Secondary | ICD-10-CM | POA: Diagnosis not present

## 2023-10-10 NOTE — Progress Notes (Signed)
 [facility] Counselor/Therapist Progress Note  Patient ID: Alexander Morrison, MRN: [id number],    Date: 10/10/2023  Time Spent: 60 mins  start time: 1400   end time: 1500  Treatment Type: Individual Therapy  Reported Symptoms: Pt presents for session via Caregility video.  He grants consent for the session, stating that he is in his home with no one else present; pt shares that he understands the limits of virtual sessions.  I shared with pt that I am in my office with no one else present.     Mental Status Exam: Appearance:  Casual     Behavior: Appropriate  Motor: Normal  Speech/Language:  Clear and Coherent  Affect: Appropriate  Mood: normal  Thought process: normal  Thought content:   WNL  Sensory/Perceptual disturbances:   WNL  Orientation: oriented to person, place, and time/date  Attention: Good  Concentration: Good  Memory: WNL  Fund of knowledge:  Good  Insight:   Good  Judgment:  Good  Impulse Control: Good   Risk Assessment: Danger to Self:  No Self-injurious Behavior: No Danger to Others: No Duty to Warn:no Physical Aggression / Violence:No  Access to Firearms a concern: No  Gang Involvement:No   Subjective: Pt shares, "I had a light mental break down on Sunday and that was weird.  I saw a boy in our neighborhood who was riding his bike with a pellet gun pistol.  I couldn't believe that his parents would allow him to do that.  That has gotten other kids killed in our [place].  I used some language in front of Alexander Morrison and Alexander Morrison and Alexander Morrison did not like that.  We talked through it and that is now OK.  I ended up going to my PCP on Monday to talk about my anxiety.  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  Pt shares that "everything in my life is going well right now so I am not sure where the anxiety is coming from."  Pt shares that he has  been trying to figure out what has triggered this anxiety episode.  Talked with pt about timing the anxious episodes he has to show himself that they are short lived.  Also encouraged pt to use his tools to reduce the impacts of these episodes on him.  He appreciates the opportunity to talk with Alexander Morrison successfully.   He continues to read the Dean Every book, "Letting Go" and that has been helpful for him.  Talked with pt about using the concept of "staying present" rather than worrying about the future of whatever his life may become.  Pt and Alexander Morrison are having a "in home date night tonight.  He has also been purposeful about drinking more water.  He has also been looking for things in his life to be thankful for and that makes him feel better too.  Encouraged pt to continue with his self care activities and we will meet in 2 wks for a follow up session.  Interventions: Cognitive Behavioral Therapy  Diagnosis:Adjustment disorder with depressed mood  Plan: Treatment Plan Strengths/Abilities:  Intelligent, Intuitive, Willing to participate in therapy Treatment Preferences:  Outpatient Individual Therapy Statement of Needs:  Patient is to use CBT, mindfulness and coping skills to help manage and/or decrease symptoms associated with their diagnosis. Symptoms:  Depressed/Irritable mood, worry,  social withdrawal Problems Addressed:  Depressive thoughts, Sadness, Sleep issues, etc. Long Term Goals:  Pt to reduce overall level, frequency, and intensity of the feelings of depression as evidenced by decreased irritability, negative self talk, and helpless feelings from 6 to 7 days/week to 0 to 1 days/week, per client report, for at least 3 consecutive months.  Progress: 30% Short Term Goals:  Pt to verbally express understanding of the relationship between feelings of depression and their impact on thinking patterns and behaviors.  Pt to verbalize an understanding of the role that distorted thinking plays in  creating fears, excessive worry, and ruminations.  Progress: 30% Target Date:  12/19/2023 Frequency:  Bi-weekly Modality:  Cognitive Behavioral Therapy Interventions by Therapist:  Therapist will use CBT, Mindfulness exercises, Coping skills and Referrals, as needed by client. Client has verbally approved this treatment plan.  Alexander Morrison, [facility]

## 2023-10-23 ENCOUNTER — Ambulatory Visit (INDEPENDENT_AMBULATORY_CARE_PROVIDER_SITE_OTHER): Admitting: Psychology

## 2023-10-23 DIAGNOSIS — F4321 Adjustment disorder with depressed mood: Secondary | ICD-10-CM | POA: Diagnosis not present

## 2023-10-23 NOTE — Progress Notes (Signed)
 [facility] Counselor/Therapist Progress Note  Patient ID: KARRIEM MUENCH, MRN: [id number],    Date: 10/23/2023  Time Spent: 60 mins  start time: 1000   end time: 1100  Treatment Type: Individual Therapy  Reported Symptoms: Pt presents for session via Caregility video.  He grants consent for the session, stating that he is in his home with no one else present; pt shares that he understands the limits of virtual sessions.  I shared with pt that I am in my office with no one else present.     Mental Status Exam: Appearance:  Casual     Behavior: Appropriate  Motor: Normal  Speech/Language:  Clear and Coherent  Affect: Appropriate  Mood: normal  Thought process: normal  Thought content:   WNL  Sensory/Perceptual disturbances:   WNL  Orientation: oriented to person, place, and time/date  Attention: Good  Concentration: Good  Memory: WNL  Fund of knowledge:  Good  Insight:   Good  Judgment:  Good  Impulse Control: Good   Risk Assessment: Danger to Self:  No Self-injurious Behavior: No Danger to Others: No Duty to Warn:no Physical Aggression / Violence:No  Access to Firearms a concern: No  Gang Involvement:No   Subjective: Pt shares, I have today off and we just got back from a walk with [place].  I also have tomorrow off as well and will be spending time with my mom for a while.  I am looking forward to going to a music event in [place] this weekend and my brother lives in [place] and we would like to see him and his family while we are there.  Pt shares that he has been diagnosed with hypertension and is on medicine for that.  He is also still taking Buspar  and believes that is helpful for him.  Pt shares that he has been intentional about decreasing the amount of alcohol he is using regularly.  He is also thinking about stopping his use of nicotine as well.  Pt shares that his mom has been admitted to psych facilities in her past as recently as when  [place] was born.  Pt shares that he and his mom and brother have been evicted from every apt they ever lived in and that was hard on him; he has only become aware of it lately.  He is happy that he and Britta Candy are making good choices for themselves as a family.  Pt is choosing to do things that are good for him (watching YouTube history videos, listening to music, still listening to his Letting Go book, etc.).  Pt only reports one anxious episode since our last session; he is using  tools that he is learning from the Letting Go book to manage his anxiety and that is working well for him.  He has also been purposeful about drinking more water.  He has also been looking for things in his life to be thankful for and that makes him feel better too.  Pt shares he continues to work in his garden at home; he is getting a lot of squash, tomatoes, and cucumbers.  Encouraged pt to continue with his self care activities and we will meet in 2 wks for a follow up session.  Interventions: Cognitive Behavioral Therapy  Diagnosis:Adjustment disorder with depressed mood  Plan: Treatment Plan Strengths/Abilities:  Intelligent, Intuitive, Willing to participate in therapy Treatment Preferences:  Outpatient Individual Therapy Statement of Needs:  Patient is to use CBT, mindfulness and  coping skills to help manage and/or decrease symptoms associated with their diagnosis. Symptoms:  Depressed/Irritable mood, worry, social withdrawal Problems Addressed:  Depressive thoughts, Sadness, Sleep issues, etc. Long Term Goals:  Pt to reduce overall level, frequency, and intensity of the feelings of depression as evidenced by decreased irritability, negative self talk, and helpless feelings from 6 to 7 days/week to 0 to 1 days/week, per client report, for at least 3 consecutive months.  Progress: 30% Short Term Goals:  Pt to verbally express understanding of the relationship between feelings of depression and their impact on  thinking patterns and behaviors.  Pt to verbalize an understanding of the role that distorted thinking plays in creating fears, excessive worry, and ruminations.  Progress: 30% Target Date:  12/19/2023 Frequency:  Bi-weekly Modality:  Cognitive Behavioral Therapy Interventions by Therapist:  Therapist will use CBT, Mindfulness exercises, Coping skills and Referrals, as needed by client. Client has verbally approved this treatment plan.  [name], [facility]

## 2023-11-01 ENCOUNTER — Other Ambulatory Visit: Payer: Self-pay | Admitting: Physician Assistant

## 2023-11-05 ENCOUNTER — Ambulatory Visit: Admitting: Psychology

## 2023-11-05 DIAGNOSIS — F4321 Adjustment disorder with depressed mood: Secondary | ICD-10-CM | POA: Diagnosis not present

## 2023-11-05 NOTE — Progress Notes (Signed)
 [facility] Counselor/Therapist Progress Note  Patient ID: Alexander Morrison, MRN: [id number],    Date: 11/05/2023  Time Spent: 60 mins  start time: 1300   end time: 1400  Treatment Type: Individual Therapy  Reported Symptoms: Pt presents for session via Caregility video.  He grants consent for the session, stating that he is in his home with no one else present; pt shares that he understands the limits of virtual sessions.  I shared with pt that I am in my office with no one else present.     Mental Status Exam: Appearance:  Casual     Behavior: Appropriate  Motor: Normal  Speech/Language:  Clear and Coherent  Affect: Appropriate  Mood: normal  Thought process: normal  Thought content:   WNL  Sensory/Perceptual disturbances:   WNL  Orientation: oriented to person, place, and time/date  Attention: Good  Concentration: Good  Memory: WNL  Fund of knowledge:  Good  Insight:   Good  Judgment:  Good  Impulse Control: Good   Risk Assessment: Danger to Self:  No Self-injurious Behavior: No Danger to Others: No Duty to Warn:no Physical Aggression / Violence:No  Access to Firearms a concern: No  Gang Involvement:No   Subjective: Pt shares, I have been pretty good since our last session.  I had [place] for a few days over the weekend because [name] had some sort of stomach bug.  Michaeline and I went to see my brother and his family on Saturday and that was a good time.  Work has been OK but I am still stressed about a couple of coworkers and how they are acting.  I have talked to my boss about them and there is not much to be done about it.  It is still frustrating.  Pt shares that he feels there is a racial component to these coworkers as well so he is trying to navigate that as well.  Pt shares that they are having some work done around their home this week (new roof, washed the house and deck, etc.).  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  He is hoping to have vendors and sponsors at the event as well.  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  He and [name] are going to a charity event for the 4th and he hopes to be able to network with people there for his event in October.  Pt shares that his BP has been pretty good since he is taking it regularly at home.  Pt shares that he has been trying to exercise as much as he can, based on weather.  He has lost 25 pounds since January and he is happy about that.  He is also still taking Buspar  and believes that is helpful for him.  Pt continues to manage his alcohol use well.  Pt is choosing to do things that are good for him (watching YouTube history videos, listening to music, still listening to his Letting Go book, etc.).  He has also been purposeful about drinking more water.  Pt shares he continues to work in his garden at home; he is getting a lot of squash, tomatoes, and cucumbers.  Encouraged pt to continue with his self care activities and we will meet in 2 wks for a  follow up session.  Interventions: Cognitive Behavioral Therapy  Diagnosis:Adjustment disorder with depressed mood  Plan: Treatment Plan Strengths/Abilities:  Intelligent, Intuitive, Willing to participate in therapy Treatment Preferences:  Outpatient Individual Therapy Statement of Needs:  Patient is to use CBT, mindfulness and coping skills to help manage and/or decrease symptoms associated with their diagnosis. Symptoms:  Depressed/Irritable mood, worry, social withdrawal Problems Addressed:  Depressive thoughts, Sadness, Sleep issues, etc. Long Term Goals:  Pt to reduce overall level, frequency, and intensity of the feelings of depression as evidenced by decreased irritability, negative self talk, and helpless feelings from 6 to 7 days/week to 0 to 1 days/week, per client report, for at least 3  consecutive months.  Progress: 30% Short Term Goals:  Pt to verbally express understanding of the relationship between feelings of depression and their impact on thinking patterns and behaviors.  Pt to verbalize an understanding of the role that distorted thinking plays in creating fears, excessive worry, and ruminations.  Progress: 30% Target Date:  12/19/2023 Frequency:  Bi-weekly Modality:  Cognitive Behavioral Therapy Interventions by Therapist:  Therapist will use CBT, Mindfulness exercises, Coping skills and Referrals, as needed by client. Client has verbally approved this treatment plan.  Francis KATHEE Macintosh, [facility]

## 2023-11-12 ENCOUNTER — Ambulatory Visit: Admitting: Physician Assistant

## 2023-11-12 ENCOUNTER — Ambulatory Visit: Payer: Self-pay | Admitting: Physician Assistant

## 2023-11-12 VITALS — BP 130/86 | HR 68 | Temp 97.8°F | Ht 71.0 in | Wt 284.2 lb

## 2023-11-12 DIAGNOSIS — E781 Pure hyperglyceridemia: Secondary | ICD-10-CM

## 2023-11-12 DIAGNOSIS — D72819 Decreased white blood cell count, unspecified: Secondary | ICD-10-CM | POA: Diagnosis not present

## 2023-11-12 DIAGNOSIS — F419 Anxiety disorder, unspecified: Secondary | ICD-10-CM

## 2023-11-12 DIAGNOSIS — E669 Obesity, unspecified: Secondary | ICD-10-CM | POA: Diagnosis not present

## 2023-11-12 DIAGNOSIS — Z6839 Body mass index (BMI) 39.0-39.9, adult: Secondary | ICD-10-CM

## 2023-11-12 DIAGNOSIS — I10 Essential (primary) hypertension: Secondary | ICD-10-CM | POA: Diagnosis not present

## 2023-11-12 DIAGNOSIS — Z0000 Encounter for general adult medical examination without abnormal findings: Secondary | ICD-10-CM

## 2023-11-12 DIAGNOSIS — J4540 Moderate persistent asthma, uncomplicated: Secondary | ICD-10-CM

## 2023-11-12 DIAGNOSIS — F32A Depression, unspecified: Secondary | ICD-10-CM

## 2023-11-12 DIAGNOSIS — E88819 Insulin resistance, unspecified: Secondary | ICD-10-CM

## 2023-11-12 LAB — CBC WITH DIFFERENTIAL/PLATELET
Basophils Absolute: 0 K/uL (ref 0.0–0.1)
Basophils Relative: 0.9 % (ref 0.0–3.0)
Eosinophils Absolute: 0.1 K/uL (ref 0.0–0.7)
Eosinophils Relative: 1.5 % (ref 0.0–5.0)
HCT: 43.1 % (ref 39.0–52.0)
Hemoglobin: 14 g/dL (ref 13.0–17.0)
Lymphocytes Relative: 36.6 % (ref 12.0–46.0)
Lymphs Abs: 1.5 K/uL (ref 0.7–4.0)
MCHC: 32.6 g/dL (ref 30.0–36.0)
MCV: 86.2 fl (ref 78.0–100.0)
Monocytes Absolute: 0.4 K/uL (ref 0.1–1.0)
Monocytes Relative: 9.5 % (ref 3.0–12.0)
Neutro Abs: 2.1 K/uL (ref 1.4–7.7)
Neutrophils Relative %: 51.5 % (ref 43.0–77.0)
Platelets: 289 K/uL (ref 150.0–400.0)
RBC: 4.99 Mil/uL (ref 4.22–5.81)
RDW: 13.8 % (ref 11.5–15.5)
WBC: 4 K/uL (ref 4.0–10.5)

## 2023-11-12 LAB — LIPID PANEL
Cholesterol: 169 mg/dL (ref 0–200)
HDL: 52 mg/dL (ref >39.00)
LDL Cholesterol: 91 mg/dL (ref 0–99)
NonHDL: 116.6
Total CHOL/HDL Ratio: 3
Triglycerides: 126 mg/dL (ref 0.0–149.0)
VLDL: 25.2 mg/dL (ref 0.0–40.0)

## 2023-11-12 MED ORDER — VALSARTAN 80 MG PO TABS: 80.0000 mg | ORAL_TABLET | Freq: Every day | ORAL | 3 refills | Status: AC

## 2023-11-12 NOTE — Progress Notes (Signed)
 Subjective:    Alexander Morrison is a 38 y.o. male and is here for a comprehensive physical exam.  Health Maintenance Due  Topic Date Due   Hepatitis B Vaccines (1 of 3 - 19+ 3-dose series) Never done   HPV VACCINES (1 - 3-dose SCDM series) Never done   Acute Concerns: No acute concerns reported today.  Chronic Issues: Anxiety / Depression  Pt is on Buspar  5-15 mg TID as needed. He reports Buspar  eases his anxiety slightly, but is still occasionally anxious. He reports he takes half a tablet of Buspar  in the morning and will take another half if he notices he starts self-soothing. He states he has never taken a full tablet at once. He also reports no intolerances. Pt states he will start trying Buspar  10 mg.  HTN  Pt is on Valsartan  50 mg daily and report good compliance and tolerance. He monitors his BP at home and reports the lowest reading at 128/80 and this morning at 140/90. He endorses that he is still working on a healthier lifestyle and that he is 280 lb on his scale at home. He also has been eating out less and has a goal to lose 30 lb. Pt is agreeable to increasing his dose.  Asthma  Pt is on Qvar  40 mcg and Albuterol  108 mcg. He reports he has not been using Qvar  daily and has not felt the need to use his rescue inhaler. He states he will only use Qvar  when there is a lot of pollen outside or if he knows he will come in close contact with a cat. When he knows he's going to be around a cat, pt states he will use Qvar  for a few days before and the day of. Denies exercise-induced asthma. Asthma is well-managed. No acute concerns reported today.  Headaches Pt reports he still gets headaches occasionally and thinks it is due to teeth grinding. He endorses using his CPAP, but not daily, due to occasional nasal congestion. He states he has been using nasal strips instead and reports it helps.  Health Maintenance: Immunizations -- See above. Colonoscopy -- N/a PSA --  No results found for: PSA1, PSA Diet -- Has been eating out less. Sleep habits -- No concerns. Goal is 8 hours, but sometimes gets 6-7 hours of sleep. Exercise -- Goes to the [employer] and has been walking more.  Weight --  Recent weight history Wt Readings from Last 10 Encounters:  11/12/23 284 lb 4 oz (128.9 kg)  10/07/23 291 lb 6.1 oz (132.2 kg)  06/05/23 (!) 306 lb 8 oz (139 kg)  02/28/23 (!) 313 lb (142 kg)  12/30/22 (!) 309 lb (140.2 kg)  12/02/22 (!) 310 lb (140.6 kg)  11/12/22 (!) 308 lb (139.7 kg)  10/30/22 (!) 307 lb 8 oz (139.5 kg)  11/07/21 296 lb 8 oz (134.5 kg)  10/10/21 (!) 300 lb 6.1 oz (136.3 kg)   Body mass index is 39.64 kg/m.  Mood -- Stable. Alcohol use --  reports that he does not currently use alcohol.  Tobacco use --  Tobacco Use: Medium Risk (11/12/2023)   Patient History    Smoking Tobacco Use: Former    Smokeless Tobacco Use: Never    Passive Exposure: Not on file    Eligible for Low Dose CT? no  UTD with eye doctor? No, but intends to see one soon UTD with dentist? No, but intends to see one     10/07/2023  11:57 AM  Depression screen PHQ 2/9  Decreased Interest 0  Down, Depressed, Hopeless 2  PHQ - 2 Score 2  Altered sleeping 2  Tired, decreased energy 3  Change in appetite 1  Feeling bad or failure about yourself  3  Trouble concentrating 2  Moving slowly or fidgety/restless 1  Suicidal thoughts 0  PHQ-9 Score 14  Difficult doing work/chores Somewhat difficult    Other providers/specialists: Patient Care Team: Job Lukes, [place] as PCP - General (Physician Assistant)    PMHx, SurgHx, SocialHx, Medications, and Allergies were reviewed in the Visit Navigator and updated as appropriate.   Past Medical History:  Diagnosis Date   Allergy      Past Surgical History:  Procedure Laterality Date   APPENDECTOMY     HIP PINNING Bilateral 2001     Family History  Problem Relation Age of Onset   Hypertension Mother     Hypertension Father        no longer on meds   Hypertension Brother    Lymphoma Brother    Alzheimer's disease Maternal Grandmother    Colon cancer Maternal Grandfather 16       late 60's   Hypertension Paternal Grandmother    Heart disease Paternal Grandmother    Diabetes Paternal Grandmother    Diabetes Other    CAD Other    Cancer Other    Stroke Other    Prostate cancer Neg Hx     Social History   Tobacco Use   Smoking status: Former    Current packs/day: 0.00    Types: Cigarettes    Quit date: 09/01/2012    Years since quitting: 11.2   Smokeless tobacco: Never  Vaping Use   Vaping status: Every Day  Substance Use Topics   Alcohol use: Not Currently    Comment: 3 drinks per day x approx 3-4x/wk   Drug use: Yes    Types: Marijuana    Review of Systems:   Review of Systems  Constitutional:  Negative for chills, fever, malaise/fatigue and weight loss.  HENT:  Negative for hearing loss, sinus pain and sore throat.   Respiratory:  Negative for cough and hemoptysis.   Cardiovascular:  Negative for chest pain, palpitations, leg swelling and PND.  Gastrointestinal:  Negative for abdominal pain, constipation, diarrhea, heartburn, nausea and vomiting.  Genitourinary:  Negative for dysuria, frequency and urgency.  Musculoskeletal:  Negative for back pain, myalgias and neck pain.  Skin:  Negative for itching and rash.  Neurological:  Negative for dizziness, tingling, seizures and headaches.  Endo/Heme/Allergies:  Negative for polydipsia.  Psychiatric/Behavioral:  Negative for depression. The patient is not nervous/anxious.       Objective:    Vitals:   11/12/23 0807  BP: (!) 150/90  Pulse: 68  Temp: 97.8 F (36.6 C)  SpO2: 96%    Body mass index is 39.64 kg/m.  General  Alert, cooperative, no distress, appears stated age  Head:  Normocephalic, without obvious abnormality, atraumatic  Eyes:  PERRL, conjunctiva/corneas clear, EOM's intact, fundi benign, both  eyes       Ears:  Normal TM's and external ear canals, both ears  Nose: Nares normal, septum midline, mucosa normal, no drainage or sinus tenderness  Throat: Lips, mucosa, and tongue normal; teeth and gums normal  Neck: Supple, symmetrical, trachea midline, no adenopathy;     thyroid :  No enlargement/tenderness/nodules; no carotid bruit or JVD  Back:   Symmetric, no curvature, ROM normal, no CVA  tenderness  Lungs:   Clear to auscultation bilaterally, respirations unlabored  Chest wall:  No tenderness or deformity  Heart:  Regular rate and rhythm, S1 and S2 normal, no murmur, rub or gallop  Abdomen:   Soft, non-tender, bowel sounds active all four quadrants, no masses, no organomegaly  Extremities: Extremities normal, atraumatic, no cyanosis or edema  Prostate : Deferred  Skin: Skin color, texture, turgor normal, no rashes or lesions  Lymph nodes: Cervical, supraclavicular, and axillary nodes normal  Neurologic: CNII-XII grossly intact. Normal strength, sensation and reflexes throughout   AssessmentPlan:   Routine physical examination Today patient counseled on age appropriate routine health concerns for screening and prevention, each reviewed and up to date or declined. Immunizations reviewed and up to date or declined. Labs ordered and reviewed. Risk factors for depression reviewed and negative. Hearing function and visual acuity are intact. ADLs screened and addressed as needed. Functional ability and level of safety reviewed and appropriate. Education, counseling and referrals performed based on assessed risks today. Patient provided with a copy of personalized plan for preventive services.  Primary hypertension Improved on recheck but still above goal Will increase valsartan  to 80 mg daily Follow up in 6 month(s), sooner if concerns  Insulin resistance Continue efforts at healthy diet and exercise  Obesity, unspecified class, unspecified obesity type, unspecified whether serious  comorbidity present Continue efforts at healthy diet and exercise  Moderate persistent asthma without complication Overall controlled Continue QVAR  prior to episodes where he is around cats/triggers Follow up as needed  Leukopenia, unspecified type Will recheck incidental WBC abnormal results today No red flag symptom(s)   Pure hypertriglyceridemia Recheck today -- he is fasting  Anxiety and Depression Currently stable May consider increasing buspar  to 10 mg as needed  Follow up in 6 month(s), sooner if concerns I discussed with patient that if they develop any SI, to tell someone immediately and seek medical attention.    I, Lavern Simmers, acting as a Neurosurgeon for [employer], [place]., have documented all relevant documentation on the behalf of [name], [place], as directed by [name], PA while in the presence of [name], [place].  I, [name], [place], have reviewed all documentation for this visit. The documentation on 11/12/23 for the exam, diagnosis, procedures, and orders are all accurate and complete.  [name], PA-C [facility]

## 2023-11-12 NOTE — Patient Instructions (Addendum)
 It was great to see you!  Please ask front desk to refund your co-pay as we completed your physical today  Increase valsartan  to 80 mg daily -- goal is to consistently get your blood pressure around 130/80 Continue to work on exercising Try to increase to 10 mg buspar   Let's follow-up in 6 months, sooner if you have concerns.  Take care,  [name] PA-C

## 2023-11-20 ENCOUNTER — Ambulatory Visit (INDEPENDENT_AMBULATORY_CARE_PROVIDER_SITE_OTHER): Admitting: Psychology

## 2023-11-20 DIAGNOSIS — F4321 Adjustment disorder with depressed mood: Secondary | ICD-10-CM | POA: Diagnosis not present

## 2023-11-20 NOTE — Progress Notes (Signed)
 [facility] Counselor/Therapist Progress Note  Patient ID: Alexander Morrison, MRN: [id number],    Date: 11/20/2023  Time Spent: 60 mins  start time: 1300   end time: 1400  Treatment Type: Individual Therapy  Reported Symptoms: Pt presents for session via Caregility video.  He grants consent for the session, stating that he is in his home with no one else present; pt shares that he understands the limits of virtual sessions.  I shared with pt that I am in my office with no one else present.     Mental Status Exam: Appearance:  Casual     Behavior: Appropriate  Motor: Normal  Speech/Language:  Clear and Coherent  Affect: Appropriate  Mood: normal  Thought process: normal  Thought content:   WNL  Sensory/Perceptual disturbances:   WNL  Orientation: oriented to person, place, and time/date  Attention: Good  Concentration: Good  Memory: WNL  Fund of knowledge:  Good  Insight:   Good  Judgment:  Good  Impulse Control: Good   Risk Assessment: Danger to Self:  No Self-injurious Behavior: No Danger to Others: No Duty to Warn:no Physical Aggression / Violence:No  Access to Firearms a concern: No  Gang Involvement:No   Subjective: Pt shares, I have been pretty good since our last session.  I am dealing with some drama at work and I am kind of over that.  I am not too taken by it as long as my check stays the same and my workload stays the same, I am OK.  Pt shares he has had to do breathing and grounding exercises to help him calm down after an internal conversation with a coworker; congratulated pt on using his skills to benefit himself in this situation.  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  Pt has earned a bonus from work and should get it in the next few  weeks.  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  He shares that he feels a significant amount of freedom when making his music; I can say whatever I want to say.  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  He is still hoping to have vendors and sponsors at the event as well.  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  Pt had a physical and his lab work was OK; he has lost about 30 pounds since the first of the year; his PCP did increase his BP medicine.  He is also still taking Buspar  and believes that is helpful for him.  Pt continues to manage his alcohol use well.  Pt is choosing to do things that are good for him (watching YouTube history videos, listening to music, still listening to his Letting Go book, etc.).  He has also been purposeful about drinking more water.  Pt shares he continues to work in his garden at home.  Encouraged pt to continue with his self  care activities and we will meet in 2 wks for a follow up session.  Interventions: Cognitive Behavioral Therapy  Diagnosis:Adjustment disorder with depressed mood  Plan: Treatment Plan Strengths/Abilities:  Intelligent, Intuitive, Willing to participate in therapy Treatment Preferences:  Outpatient Individual Therapy Statement of Needs:  Patient is to use CBT, mindfulness and coping skills to help manage and/or decrease symptoms associated with their diagnosis. Symptoms:  Depressed/Irritable mood, worry, social withdrawal Problems Addressed:  Depressive thoughts, Sadness, Sleep issues, etc. Long Term Goals:  Pt to reduce overall level, frequency, and intensity of the feelings of depression as evidenced by decreased irritability, negative self talk, and  helpless feelings from 6 to 7 days/week to 0 to 1 days/week, per client report, for at least 3 consecutive months.  Progress: 30% Short Term Goals:  Pt to verbally express understanding of the relationship between feelings of depression and their impact on thinking patterns and behaviors.  Pt to verbalize an understanding of the role that distorted thinking plays in creating fears, excessive worry, and ruminations.  Progress: 30% Target Date:  12/19/2023 Frequency:  Bi-weekly Modality:  Cognitive Behavioral Therapy Interventions by Therapist:  Therapist will use CBT, Mindfulness exercises, Coping skills and Referrals, as needed by client. Client has verbally approved this treatment plan.  Alexander Morrison, [facility]

## 2023-12-04 ENCOUNTER — Ambulatory Visit: Admitting: Psychology

## 2023-12-04 DIAGNOSIS — F4321 Adjustment disorder with depressed mood: Secondary | ICD-10-CM | POA: Diagnosis not present

## 2023-12-04 NOTE — Progress Notes (Signed)
 [facility] Counselor/Therapist Progress Note  Patient ID: Alexander Morrison, MRN: [id number],    Date: 12/04/2023  Time Spent: 60 mins  start time: 1300   end time: 1400  Treatment Type: Individual Therapy  Reported Symptoms: Pt presents for session via Caregility video.  He grants consent for the session, stating that he is in his home with no one else present; pt shares that he understands the limits of virtual sessions.  I shared with pt that I am in my office with no one else present.     Mental Status Exam: Appearance:  Casual     Behavior: Appropriate  Motor: Normal  Speech/Language:  Clear and Coherent  Affect: Appropriate  Mood: normal  Thought process: normal  Thought content:   WNL  Sensory/Perceptual disturbances:   WNL  Orientation: oriented to person, place, and time/date  Attention: Good  Concentration: Good  Memory: WNL  Fund of knowledge:  Good  Insight:   Good  Judgment:  Good  Impulse Control: Good   Risk Assessment: Danger to Self:  No Self-injurious Behavior: No Danger to Others: No Duty to Warn:no Physical Aggression / Violence:No  Access to Firearms a concern: No  Gang Involvement:No   Subjective: Pt shares, I have had lots of ups and downs since our last session.  I have been dealing with a lot of social BS in the past couple of weeks; I have been dealing with a person who took it upon himself to express his opinion that he does not like me.  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  Pt shares he had a good time at the event he went to in [place]; he did not win the event but it was fun anyway.  Pt shares that work is picking up and he has really been busy with that and working on the October event and doing family stuff.  Pt shares that he gets frustrated by current  politics and believes people do not take time to be truly informed.  Pt shares that [name] continues to go to AA mtgs to support her sobriety and pt appreciates that about her.  He is also still taking Buspar  and believes that is helpful for him.  Pt continues to manage his alcohol use well.  Pt is choosing to do things that are good for him (watching YouTube history videos, listening to music, still listening to his Letting Go book, etc.).  Pt shares he continues to work in his garden at home.  Encouraged pt to continue with his self care activities and we will meet in 2 wks for a follow up session.  Interventions: Cognitive Behavioral Therapy  Diagnosis:Adjustment disorder with depressed mood  Plan: Treatment Plan Strengths/Abilities:  Intelligent, Intuitive, Willing to participate in therapy Treatment Preferences:  Outpatient Individual Therapy Statement of Needs:  Patient is to use CBT, mindfulness and coping skills to help manage and/or decrease symptoms associated with their diagnosis. Symptoms:  Depressed/Irritable mood, worry, social withdrawal Problems Addressed:  Depressive thoughts, Sadness, Sleep issues, etc. Long Term Goals:  Pt to reduce overall level, frequency, and intensity of the feelings of depression as evidenced by decreased irritability, negative self talk, and helpless feelings from 6 to 7 days/week to 0 to 1 days/week, per client report, for at  least 3 consecutive months.  Progress: 30% Short Term Goals:  Pt to verbally express understanding of the relationship between feelings of depression and their impact on thinking patterns and behaviors.  Pt to verbalize an understanding of the role that distorted thinking plays in creating fears, excessive worry, and ruminations.  Progress: 30% Target Date:  12/18/2024 Frequency:  Bi-weekly Modality:  Cognitive Behavioral Therapy Interventions by Therapist:  Therapist will use CBT, Mindfulness exercises, Coping skills and Referrals,  as needed by client. Client has verbally approved this treatment plan.  Francis KATHEE Macintosh, [facility]

## 2023-12-06 ENCOUNTER — Other Ambulatory Visit: Payer: Self-pay | Admitting: Physician Assistant

## 2023-12-19 ENCOUNTER — Ambulatory Visit: Admitting: Psychology

## 2023-12-19 DIAGNOSIS — F4321 Adjustment disorder with depressed mood: Secondary | ICD-10-CM | POA: Diagnosis not present

## 2023-12-19 NOTE — Progress Notes (Signed)
 [facility] Counselor/Therapist Progress Note  Patient ID: ZAMEER BORMAN, MRN: [id number],    Date: 12/19/2023  Time Spent: 60 mins  start time: 1100   end time: 1200  Treatment Type: Individual Therapy  Reported Symptoms: Pt presents for session via Caregility video.  He grants consent for the session, stating that he is in his home with no one else present; pt shares that he understands the limits of virtual sessions.  I shared with pt that I am in my office with no one else present.     Mental Status Exam: Appearance:  Casual     Behavior: Appropriate  Motor: Normal  Speech/Language:  Clear and Coherent  Affect: Appropriate  Mood: normal  Thought process: normal  Thought content:   WNL  Sensory/Perceptual disturbances:   WNL  Orientation: oriented to person, place, and time/date  Attention: Good  Concentration: Good  Memory: WNL  Fund of knowledge:  Good  Insight:   Good  Judgment:  Good  Impulse Control: Good   Risk Assessment: Danger to Self:  No Self-injurious Behavior: No Danger to Others: No Duty to Warn:no Physical Aggression / Violence:No  Access to Firearms a concern: No  Gang Involvement:No   Subjective: Pt shares, I has been going pretty well.  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  I have learned a lot about other liberal organizations in this process; it seems that they don't want anything to do black rappers.  I know it will all be OK and there are lots of other groups and organizations who are willing to help us .  Pt shares that he had a friend in the neighborhood who got injured at work and was not able to work anymore; his friend lost his home and was living in a shed behind the house.  His friend ended up being shot by police. There was then a community meeting in the neighborhood about the event and it was being help by a black male officer and pt did not  feel like she did a very good job with the meeting.  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  He has posted it on a site that will allow him to make money from it.  Pt shares that [place] are doing well; pt shares that, even though Michaeline is not quite yet 38 yo, she is in the midst of the terrible twos.  She was not nice to a kid at daycare and pt had to have a talk with her.  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  He is also still taking Buspar  and believes that is helpful for him.  Pt continues to manage his alcohol use well.  Pt is choosing to do things that are good for him (watching YouTube history videos, listening to music, etc.).  Pt shares he continues to work in his garden at home.  Encouraged pt to continue with his self care activities and we will meet in 2 wks for a follow up session.  Interventions: Cognitive Behavioral Therapy  Diagnosis:Adjustment disorder with depressed mood  Plan: Treatment Plan Strengths/Abilities:  Intelligent, Intuitive, Willing to participate in therapy Treatment Preferences:  Outpatient Individual  Therapy Statement of Needs:  Patient is to use CBT, mindfulness and coping skills to help manage and/or decrease symptoms associated with their diagnosis. Symptoms:  Depressed/Irritable mood, worry, social withdrawal Problems Addressed:  Depressive thoughts, Sadness, Sleep issues, etc. Long Term Goals:  Pt to reduce overall level, frequency, and intensity of the feelings of depression as evidenced by decreased irritability, negative self talk, and helpless feelings from 6 to 7 days/week to 0 to 1 days/week, per client report, for at least 3 consecutive months.  Progress: 30% Short Term Goals:  Pt to verbally express understanding of the relationship between feelings of depression and their impact on  thinking patterns and behaviors.  Pt to verbalize an understanding of the role that distorted thinking plays in creating fears, excessive worry, and ruminations.  Progress: 30% Target Date:  12/18/2024 Frequency:  Bi-weekly Modality:  Cognitive Behavioral Therapy Interventions by Therapist:  Therapist will use CBT, Mindfulness exercises, Coping skills and Referrals, as needed by client. Client has verbally approved this treatment plan.  Francis KATHEE Macintosh, [facility]

## 2023-12-23 ENCOUNTER — Ambulatory Visit
Admission: EM | Admit: 2023-12-23 | Discharge: 2023-12-23 | Disposition: A | Discharge status: Home / Self Care | Attending: Physician Assistant | Admitting: Physician Assistant

## 2023-12-23 ENCOUNTER — Encounter: Payer: Self-pay | Admitting: Emergency Medicine

## 2023-12-23 DIAGNOSIS — R21 Rash and other nonspecific skin eruption: Secondary | ICD-10-CM | POA: Diagnosis not present

## 2023-12-23 HISTORY — DX: Essential (primary) hypertension: I10

## 2023-12-23 MED ORDER — METHYLPREDNISOLONE SODIUM SUCC 125 MG IJ SOLR
125.0000 mg | Freq: Once | INTRAMUSCULAR | Status: AC
  Administered 2023-12-23: 125 mg via INTRAMUSCULAR

## 2023-12-23 NOTE — ED Triage Notes (Signed)
 Pt presents c/o rash x 3 days. Pt reports on Sunday he noticed he had a swollen lymph node in front of his ear. Then on Monday he noticed the rash on forearms. Now as of today the rash has spread to several places. Pt denies any contact with any known allergens. Pt does report he used a new soap recently but has since thrown it out just in case.

## 2023-12-23 NOTE — ED Provider Notes (Signed)
 [facility]    CSN: [id number] Arrival date & time: 12/23/23  1835      History   Chief Complaint Chief Complaint  Patient presents with   Rash    HPI Alexander Morrison is a 38 y.o. male.   Patient in today for evaluation of rash For 3 days.  He reports that on Sunday he noticed he thinks he had a swollen lymph node to his front of his ear.  He states rash spread to other places.  He denies any contact with known allergens.  He does report that he used a new soap recently but has thrown out just in case.   Rash Associated symptoms: no fever and no shortness of breath     Past Medical History:  Diagnosis Date   Allergy    Hypertension     Patient Active Problem List   Diagnosis Date Noted   Left leg pain 02/17/2023   Paresthesia 02/17/2023   Snoring 10/23/2016   Moderate persistent asthma without complication 10/21/2016   Depression, recurrent (HCC) 10/21/2016   Obesity 10/21/2016   Allergy 10/21/2016    Past Surgical History:  Procedure Laterality Date   APPENDECTOMY     HIP PINNING Bilateral 2001       Home Medications    Prior to Admission medications   Medication Sig Start Date End Date Taking? Authorizing Provider  busPIRone  (BUSPAR ) 15 MG tablet TAKE 1 TABLET (15 MG TOTAL) BY MOUTH 3 (THREE) TIMES DAILY AS NEEDED (ANXIETY). 12/08/23  Yes Job Lukes, PA  acetaminophen (TYLENOL) 325 MG tablet Take 650 mg by mouth every 6 (six) hours as needed.    [provider]  albuterol  (VENTOLIN  HFA) 108 (90 Base) MCG/ACT inhaler Inhale 1-2 puffs into the lungs every 4 (four) hours as needed for wheezing or shortness of breath. 02/17/23   Job Lukes, PA  beclomethasone (QVAR  REDIHALER) 40 MCG/ACT inhaler Inhale 2 puffs into the lungs 2 (two) times daily. 10/30/22   Job Lukes, PA  calcium carbonate (TUMS - DOSED IN MG ELEMENTAL CALCIUM) 500 MG chewable tablet Chew 1 tablet by mouth as needed for indigestion or heartburn.     [provider]  esomeprazole  (NEXIUM ) 40 MG capsule TAKE 1 CAPSULE (40 MG TOTAL) BY MOUTH DAILY. 11/03/23   Job Lukes, PA  fluticasone  (FLONASE ) 50 MCG/ACT nasal spray SPRAY 1 SPRAY INTO BOTH NOSTRILS DAILY. 06/03/22   Job Lukes, PA  loratadine (CLARITIN) 10 MG tablet Take 10 mg by mouth daily.    [provider]  valsartan  (DIOVAN ) 80 MG tablet Take 1 tablet (80 mg total) by mouth daily. 11/12/23   Job Lukes, PA    Family History Family History  Problem Relation Age of Onset   Hypertension Mother    Hypertension Father        no longer on meds   Hypertension Brother    Lymphoma Brother    Alzheimer's disease Maternal Grandmother    Colon cancer Maternal Grandfather 107       late 60's   Hypertension Paternal Grandmother    Heart disease Paternal Grandmother    Diabetes Paternal Grandmother    Diabetes Other    CAD Other    Cancer Other    Stroke Other    Prostate cancer Neg Hx     Social History Social History   Tobacco Use   Smoking status: Former    Current packs/day: 0.00    Types: Cigarettes    Quit date:  09/01/2012    Years since quitting: 11.3    Passive exposure: Current   Smokeless tobacco: Never  Vaping Use   Vaping status: Every Day   Substances: Nicotine  Substance Use Topics   Alcohol use: Not Currently    Comment: 3 drinks per day x approx 3-4x/wk   Drug use: Yes    Types: Marijuana     Allergies   Shellfish allergy and Iodine   Review of Systems Review of Systems  Constitutional:  Negative for chills and fever.  HENT:  Negative for trouble swallowing.   Eyes:  Negative for discharge and redness.  Respiratory:  Negative for shortness of breath.   Skin:  Positive for rash.  Neurological:  Negative for numbness.     Physical Exam Triage Vital Signs ED Triage Vitals  Encounter Vitals Group     BP 12/23/23 1906 139/85     Girls Systolic BP Percentile --      Girls Diastolic BP Percentile --      Boys  Systolic BP Percentile --      Boys Diastolic BP Percentile --      Pulse Rate 12/23/23 1906 69     Resp 12/23/23 1906 18     Temp 12/23/23 1906 98.1 F (36.7 C)     Temp Source 12/23/23 1906 Oral     SpO2 12/23/23 1906 98 %     Weight 12/23/23 1904 284 lb 2.8 oz (128.9 kg)     Height --      Head Circumference --      Peak Flow --      Pain Score 12/23/23 1904 0     Pain Loc --      Pain Education --      Exclude from Growth Chart --    No data found.  Updated Vital Signs BP 139/85 (BP Location: Left Arm)   Pulse 69   Temp 98.1 F (36.7 C) (Oral)   Resp 18   Wt 284 lb 2.8 oz (128.9 kg)   SpO2 98%   BMI 39.63 kg/m   Visual Acuity Right Eye Distance:   Left Eye Distance:   Bilateral Distance:    Right Eye Near:   Left Eye Near:    Bilateral Near:     Physical Exam Vitals and nursing note reviewed.  Constitutional:      General: He is not in acute distress.    Appearance: Normal appearance. He is not ill-appearing.  HENT:     Head: Normocephalic and atraumatic.  Eyes:     Conjunctiva/sclera: Conjunctivae normal.  Cardiovascular:     Rate and Rhythm: Normal rate.  Pulmonary:     Effort: Pulmonary effort is normal.  Skin:    Comments: Mildly erythematous papular rash to bilateral arms.  Neurological:     Mental Status: He is alert.  Psychiatric:        Mood and Affect: Mood normal.        Behavior: Behavior normal.        Thought Content: Thought content normal.      UC Treatments / Results  Labs (all labs ordered are listed, but only abnormal results are displayed) Labs Reviewed - No data to display  EKG   Radiology No results found.  Procedures Procedures (including critical care time)  Medications Ordered in UC Medications  methylPREDNISolone  sodium succinate (SOLU-MEDROL ) 125 mg/2 mL injection 125 mg (125 mg Intramuscular Given 12/23/23 1941)    Initial Impression / Assessment  and Plan / UC Course  I have reviewed the triage vital  signs and the nursing notes.  Pertinent labs & imaging results that were available during my care of the patient were reviewed by me and considered in my medical decision making (see chart for details).    Suspect likely contact dermatitis of some sort probably due to new soap he had been using.  Will treat with steroid injection and advised follow-up if no gradual improvement with any further concerns.  Final Clinical Impressions(s) / UC Diagnoses   Final diagnoses:  Rash and nonspecific skin eruption   Discharge Instructions   None    ED Prescriptions   None    PDMP not reviewed this encounter.   Billy Asberry FALCON, PA-C 12/25/23 1549

## 2023-12-25 ENCOUNTER — Ambulatory Visit: Admitting: Physician Assistant

## 2023-12-25 ENCOUNTER — Encounter: Payer: Self-pay | Admitting: Physician Assistant

## 2023-12-26 ENCOUNTER — Ambulatory Visit: Admitting: Psychology

## 2024-01-16 ENCOUNTER — Ambulatory Visit (INDEPENDENT_AMBULATORY_CARE_PROVIDER_SITE_OTHER): Admitting: Psychology

## 2024-01-16 DIAGNOSIS — F4321 Adjustment disorder with depressed mood: Secondary | ICD-10-CM | POA: Diagnosis not present

## 2024-01-16 NOTE — Progress Notes (Signed)
 [facility] Counselor/Therapist Progress Note  Patient ID: Alexander Morrison, MRN: [id number],    Date: 01/16/2024  Time Spent: 50 mins  start time: 1405   end time: 1455  Treatment Type: Individual Therapy  Reported Symptoms: Pt presents for session via Caregility video.  He grants consent for the session, stating that he is in his home with no one else present; pt shares that he understands the limits of virtual sessions.  I shared with pt that I am in my office with no one else present.     Mental Status Exam: Appearance:  Casual     Behavior: Appropriate  Motor: Normal  Speech/Language:  Clear and Coherent  Affect: Appropriate  Mood: normal  Thought process: normal  Thought content:   WNL  Sensory/Perceptual disturbances:   WNL  Orientation: oriented to person, place, and time/date  Attention: Good  Concentration: Good  Memory: WNL  Fund of knowledge:  Good  Insight:   Good  Judgment:  Good  Impulse Control: Good   Risk Assessment: Danger to Self:  No Self-injurious Behavior: No Danger to Others: No Duty to Warn:no Physical Aggression / Violence:No  Access to Firearms a concern: No  Gang Involvement:No   Subjective: Pt shares, I has been going pretty well.  I had to cancel our last session because [place] got sick.  She is better now but we decided to change her to a new daycare center.  She is adjusting to it and it is going OK.  We are going to the beach for Junney's second birthday (9/20-24) and we are looking forward to that trip.  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  Pt has 3 more performances upcoming and he will be glad when he is past those and wants to take a break.  Pt shares his perspective on the Bebe Mulders killing and he has some interesting thoughts from his racial perspectives.  Pt shares that [place] are doing well.  Pt shares that his sister-in-law is apparently dealing with  post-partum depression; pt is hopeful that she will get the help she needs.  Pt went to [facility] last week with his dad to visit his dad's side of the family.   He is also still taking Buspar  and believes that is helpful for him.  Pt continues to manage his alcohol use well.  Pt is choosing to do things that are good for him (watching YouTube history videos, listening to music, etc.).  Encouraged pt to continue with his self care activities and we will meet in 2 wks for a follow up session.  Interventions: Cognitive Behavioral Therapy  Diagnosis:Adjustment disorder with depressed mood  Plan: Treatment Plan Strengths/Abilities:  Intelligent, Intuitive, Willing to participate in therapy Treatment Preferences:  Outpatient Individual Therapy Statement of Needs:  Patient is to use CBT, mindfulness and coping skills to help manage and/or decrease symptoms associated with their diagnosis. Symptoms:  Depressed/Irritable mood, worry, social withdrawal Problems Addressed:  Depressive thoughts, Sadness, Sleep issues, etc. Long Term Goals:  Pt to reduce overall level, frequency, and intensity of the feelings of depression as evidenced by decreased irritability, negative self talk, and helpless feelings from 6 to 7 days/week to 0 to 1 days/week, per client report, for at least 3 consecutive months.  Progress: 30% Short Term Goals:  Pt to verbally express understanding of the relationship between feelings of depression and their impact on thinking patterns and behaviors.  Pt to verbalize an understanding of the role that distorted thinking plays in creating fears, excessive worry, and ruminations.  Progress: 30% Target Date:  12/18/2024 Frequency:  Bi-weekly Modality:  Cognitive Behavioral Therapy Interventions by Therapist:  Therapist will use CBT, Mindfulness exercises, Coping skills and Referrals, as needed by client. Client has verbally approved this treatment plan.  Francis KATHEE Macintosh, [facility]

## 2024-01-30 ENCOUNTER — Ambulatory Visit (INDEPENDENT_AMBULATORY_CARE_PROVIDER_SITE_OTHER): Admitting: Psychology

## 2024-01-30 DIAGNOSIS — F4321 Adjustment disorder with depressed mood: Secondary | ICD-10-CM

## 2024-01-30 NOTE — Progress Notes (Signed)
 [facility] Counselor/Therapist Progress Note  Patient ID: DANNEL RAFTER, MRN: [id number],    Date: 01/30/2024  Time Spent: 55 mins  start time: 1100   end time: 1155  Treatment Type: Individual Therapy  Reported Symptoms: Pt presents for session via Caregility video.  He grants consent for the session, stating that he is in his home with no one else present; pt shares that he understands the limits of virtual sessions.  I shared with pt that I am in my office with no one else present.     Mental Status Exam: Appearance:  Casual     Behavior: Appropriate  Motor: Normal  Speech/Language:  Clear and Coherent  Affect: Appropriate  Mood: normal  Thought process: normal  Thought content:   WNL  Sensory/Perceptual disturbances:   WNL  Orientation: oriented to person, place, and time/date  Attention: Good  Concentration: Good  Memory: WNL  Fund of knowledge:  Good  Insight:   Good  Judgment:  Good  Impulse Control: Good   Risk Assessment: Danger to Self:  No Self-injurious Behavior: No Danger to Others: No Duty to Warn:no Physical Aggression / Violence:No  Access to Firearms a concern: No  Gang Involvement:No   Subjective: Pt shares, We just got back from vacation with another family of a two year old.  That took a bit out of me but I enjoyed the beach.  I did not have to do too much with work or music while we were gone.  I came back to work yesterday and it has been an adjustment for me.  Pt shares that he is busy this weekend; Morgan's dad and younger brother (69 yo) are coming into town this weekend.  Her dad is very conservative and he likes to talk about politics with everyone; [name] is a little concerned about having him in town.  Sunday night, pt has a performance in [place] that he is doing.  Pt shares that [place] enjoyed [employer] and pt enjoyed being with her and [place].  Their friends ROSELLEN, Rankin, their son Dempsey) were nice people; and he was glad to  get home.  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  Pt shares that his brother continues to handle a lot of the parenting duties for there kids; his wife seems to be improving her mood a bit and that is good.  He is also still taking Buspar  and believes that is helpful for him.  Pt continues to manage his alcohol use well; he did also manage his pot use while on vacation and was proud of that.  Pt is choosing to do things that are good for him (watching YouTube history videos, listening to music, etc.).  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  Encouraged pt to continue with his self care activities and we will meet in 2 wks for a follow up session.  Interventions: Cognitive Behavioral Therapy  Diagnosis:Adjustment disorder with depressed mood  Plan: Treatment Plan Strengths/Abilities:  Intelligent, Intuitive, Willing to participate in therapy Treatment Preferences:  Outpatient Individual Therapy Statement of Needs:  Patient is to use CBT, mindfulness and coping skills to help manage and/or decrease symptoms associated with their diagnosis. Symptoms:  Depressed/Irritable mood, worry, social withdrawal Problems Addressed:  Depressive thoughts, Sadness, Sleep issues, etc. Long Term Goals:  Pt to reduce overall level, frequency, and intensity of the feelings of  depression as evidenced by decreased irritability, negative self talk, and helpless feelings from 6 to 7 days/week to 0 to 1 days/week, per client report, for at least 3 consecutive months.  Progress: 30% Short Term Goals:  Pt to verbally express understanding of the relationship between feelings of depression and their impact on thinking patterns and behaviors.  Pt to verbalize an understanding of the role that distorted thinking plays in creating fears, excessive worry, and ruminations.  Progress: 30% Target Date:  12/18/2024 Frequency:   Bi-weekly Modality:  Cognitive Behavioral Therapy Interventions by Therapist:  Therapist will use CBT, Mindfulness exercises, Coping skills and Referrals, as needed by client. Client has verbally approved this treatment plan.  Francis KATHEE Macintosh, [facility]

## 2024-02-04 ENCOUNTER — Other Ambulatory Visit: Payer: Self-pay | Admitting: *Deleted

## 2024-02-04 MED ORDER — ESOMEPRAZOLE MAGNESIUM 40 MG PO CPDR: 40.0000 mg | DELAYED_RELEASE_CAPSULE | Freq: Every day | ORAL | 1 refills | Status: AC

## 2024-02-13 ENCOUNTER — Ambulatory Visit (INDEPENDENT_AMBULATORY_CARE_PROVIDER_SITE_OTHER): Admitting: Psychology

## 2024-02-13 DIAGNOSIS — F4321 Adjustment disorder with depressed mood: Secondary | ICD-10-CM | POA: Diagnosis not present

## 2024-02-13 NOTE — Progress Notes (Signed)
 [facility] Counselor/Therapist Progress Note  Patient ID: Alexander Morrison, MRN: [id number],    Date: 02/13/2024  Time Spent: 55 mins  start time: 1300   end time: 1355  Treatment Type: Individual Therapy  Reported Symptoms: Pt presents for session via Caregility video.  He grants consent for the session, stating that he is in his home with no one else present; pt shares that he understands the limits of virtual sessions.  I shared with pt that I am in my office with no one else present.     Mental Status Exam: Appearance:  Casual     Behavior: Appropriate  Motor: Normal  Speech/Language:  Clear and Coherent  Affect: Appropriate  Mood: normal  Thought process: normal  Thought content:   WNL  Sensory/Perceptual disturbances:   WNL  Orientation: oriented to person, place, and time/date  Attention: Good  Concentration: Good  Memory: WNL  Fund of knowledge:  Good  Insight:   Good  Judgment:  Good  Impulse Control: Good   Risk Assessment: Danger to Self:  No Self-injurious Behavior: No Danger to Others: No Duty to Warn:no Physical Aggression / Violence:No  Access to Firearms a concern: No  Gang Involvement:No   Subjective: Pt shares, I have just been really busy 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  Work has also been busy; a person that I used to partner with at work on several accounts has been let go and that has increased my responsibilities at work.  Pt has done multiple shows since our last session and he has enjoyed being able to perform.  Pt shares that he loves creating and it is additive for his life, rather than taking away from it.  Pt shares that the visit from Morgan's dad and brother went OK; her dad brought up one political issue and that was all for the weekend and that was fine with pt.  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   Pt shares that Juney's birthday celebrations are now concluded.  Pt shares that he is attempting to ween himself off Buspar  to see how he feels with his brain clear; he knows he can start it back, if needed.  Pt continues to manage his alcohol and pot use well.  Pt is choosing to do things that are good for him (watching YouTube history videos, listening to music, etc.).  Encouraged pt to continue with his self care activities and we will meet in 2 wks for a follow up session.  Interventions: Cognitive Behavioral Therapy  Diagnosis:Adjustment disorder with depressed mood  Plan: Treatment Plan Strengths/Abilities:  Intelligent, Intuitive, Willing to participate in therapy Treatment Preferences:  Outpatient Individual Therapy Statement of Needs:  Patient is to use CBT, mindfulness and coping skills to help manage and/or decrease symptoms associated with their diagnosis. Symptoms:  Depressed/Irritable mood, worry, social withdrawal Problems Addressed:  Depressive thoughts, Sadness, Sleep issues, etc. Long Term Goals:  Pt to reduce overall level, frequency, and intensity of the feelings of depression as evidenced by decreased irritability, negative self talk, and helpless feelings from 6 to 7 days/week to 0 to 1 days/week, per client report, for at least 3 consecutive months.  Progress: 30% Short Term Goals:  Pt to verbally express understanding of the relationship between feelings of depression and their impact on thinking patterns and behaviors.  Pt to  verbalize an understanding of the role that distorted thinking plays in creating fears, excessive worry, and ruminations.  Progress: 30% Target Date:  12/18/2024 Frequency:  Bi-weekly Modality:  Cognitive Behavioral Therapy Interventions by Therapist:  Therapist will use CBT, Mindfulness exercises, Coping skills and Referrals, as needed by client. Client has verbally approved this treatment plan.  Francis KATHEE Macintosh, [facility]

## 2024-02-25 ENCOUNTER — Ambulatory Visit (INDEPENDENT_AMBULATORY_CARE_PROVIDER_SITE_OTHER): Admitting: Psychology

## 2024-02-25 DIAGNOSIS — F4321 Adjustment disorder with depressed mood: Secondary | ICD-10-CM | POA: Diagnosis not present

## 2024-02-25 NOTE — Progress Notes (Signed)
 [facility] Counselor/Therapist Progress Note  Patient ID: Alexander Morrison, MRN: [id number],    Date: 02/25/2024  Time Spent: 58 mins  start time: 1300   end time: 1358  Treatment Type: Individual Therapy  Reported Symptoms: Pt presents for session via Caregility video.  He grants consent for the session, stating that he is in his home with no one else present; pt shares that he understands the limits of virtual sessions.  I shared with pt that I am in my office with no one else present.     Mental Status Exam: Appearance:  Casual     Behavior: Appropriate  Motor: Normal  Speech/Language:  Clear and Coherent  Affect: Appropriate  Mood: normal  Thought process: normal  Thought content:   WNL  Sensory/Perceptual disturbances:   WNL  Orientation: oriented to person, place, and time/date  Attention: Good  Concentration: Good  Memory: WNL  Fund of knowledge:  Good  Insight:   Good  Judgment:  Good  Impulse Control: Good   Risk Assessment: Danger to Self:  No Self-injurious Behavior: No Danger to Others: No Duty to Warn:no Physical Aggression / Violence:No  Access to Firearms a concern: No  Gang Involvement:No   Subjective: Pt shares, I have been really busy at work; we are now having quarterly status updates with our managers for evaluations and that seems odd to me; I am not sure what that new process means.  I am also continuing to have issues with several of my colleagues in a different dept; it seems that they try to push me out of meetings from time to time.  Pt shares that his community event last weekend worked out really well; lots of people came out and donated and it seemed that everything went about as well as it could have.  Pt shares that he just got over an illness that he got from [place].  There has been a lot of things going on for pt at this time; I wish I just had about 3 wks to unplug.  Encouraged pt to continue to give himself grace during  this period of being so tired.  Pt has started back taking his Buspar  as a result of struggling with his mood; encouraged pt to be willing to continue taking it daily.  Pt continues to manage his alcohol and pot use well.  Pt is choosing to do things that are good for him (watching YouTube history videos, listening to music, reading, etc.).  Encouraged pt to continue with his self care activities and we will meet in 3 wks for a follow up session.  Interventions: Cognitive Behavioral Therapy  Diagnosis:Adjustment disorder with depressed mood  Plan: Treatment Plan Strengths/Abilities:  Intelligent, Intuitive, Willing to participate in therapy Treatment Preferences:  Outpatient Individual Therapy Statement of Needs:  Patient is to use CBT, mindfulness and coping skills to help manage and/or decrease symptoms associated with their diagnosis. Symptoms:  Depressed/Irritable mood, worry, social withdrawal Problems Addressed:  Depressive thoughts, Sadness, Sleep issues, etc. Long Term Goals:  Pt to reduce overall level, frequency, and intensity of the feelings of depression as evidenced by decreased irritability, negative self talk, and helpless feelings from 6 to 7 days/week to 0 to 1 days/week, per client report, for at least 3 consecutive months.  Progress: 30% Short Term Goals:  Pt to verbally express understanding of the relationship between feelings of depression and their impact on thinking patterns and behaviors.  Pt to verbalize an understanding  of the role that distorted thinking plays in creating fears, excessive worry, and ruminations.  Progress: 30% Target Date:  12/18/2024 Frequency:  Bi-weekly Modality:  Cognitive Behavioral Therapy Interventions by Therapist:  Therapist will use CBT, Mindfulness exercises, Coping skills and Referrals, as needed by client. Client has verbally approved this treatment plan.  Francis KATHEE Macintosh, [facility]

## 2024-03-07 ENCOUNTER — Other Ambulatory Visit: Payer: Self-pay | Admitting: Physician Assistant

## 2024-03-18 ENCOUNTER — Ambulatory Visit: Admitting: Psychology

## 2024-03-18 DIAGNOSIS — F4321 Adjustment disorder with depressed mood: Secondary | ICD-10-CM | POA: Diagnosis not present

## 2024-03-18 NOTE — Progress Notes (Signed)
 [facility] Counselor/Therapist Progress Note  Patient ID: Alexander Morrison, MRN: [id number],    Date: 03/18/2024  Time Spent: 58 mins  start time: 1300   end time: 1358  Treatment Type: Individual Therapy  Reported Symptoms: Pt presents for session via Caregility video.  He grants consent for the session, stating that he is in his home with no one else present; pt shares that he understands the limits of virtual sessions.  I shared with pt that I am in my office with no one else present.     Mental Status Exam: Appearance:  Casual     Behavior: Appropriate  Motor: Normal  Speech/Language:  Clear and Coherent  Affect: Appropriate  Mood: normal  Thought process: normal  Thought content:   WNL  Sensory/Perceptual disturbances:   WNL  Orientation: oriented to person, place, and time/date  Attention: Good  Concentration: Good  Memory: WNL  Fund of knowledge:  Good  Insight:   Good  Judgment:  Good  Impulse Control: Good   Risk Assessment: Danger to Self:  No Self-injurious Behavior: No Danger to Others: No Duty to Warn:no Physical Aggression / Violence:No  Access to Firearms a concern: No  Gang Involvement:No   Subjective: Pt shares, I am having a rough go of it lately.  I have not been able to get into self care activities lately.  Our issues are not with Juney.  Work has been hard too; we are getting into goals and stuff and that is not going well.  I am also having some stomach issues lately. Yesterday was awful; I am irritable and irritated with everything.  [name] and I got into an argument yesterday and I got defensive; I told her that I feel like I am being a burden to her and that feels terrible.  My mom is coming to visit this weekend and I hope that goes well.  Pt shares he has not been very active lately and my sleep has been poor as well.  Pt shares that he and [name] have talked about how they need a break but they don't have anyone other than  Morgan's mom.  Pt shares that he has been sleeping between 4-6.5 hours each night.  Encouraged pt to get as much movement into his day as he can each day.  He has not been reading as much as usual lately.  He is thinking about joining the gym at [facility].  Pt shares his alcohol use has not been terrible during this time; he has been managing his use pretty well; no more than 4 drinks per episode 3-4 times per week.  Pt would like to stop using nicotine; encouraged pt to talk with his PCP about options; pt does not want to use prescriptions for this purpose.  Pt continues to take his Buspar  daily.  Pt is planning to take a break from his pot use to give his lungs a break as well.  Encouraged pt to continue with his self care activities and we will meet in 3 wks for a follow up session.  Interventions: Cognitive Behavioral Therapy  Diagnosis:Adjustment disorder with depressed mood  Plan: Treatment Plan Strengths/Abilities:  Intelligent, Intuitive, Willing to participate in therapy Treatment Preferences:  Outpatient Individual Therapy Statement of Needs:  Patient is to use CBT, mindfulness and coping skills to help manage and/or decrease symptoms associated with their diagnosis. Symptoms:  Depressed/Irritable mood, worry, social withdrawal Problems Addressed:  Depressive thoughts, Sadness, Sleep issues, etc. Long  Term Goals:  Pt to reduce overall level, frequency, and intensity of the feelings of depression as evidenced by decreased irritability, negative self talk, and helpless feelings from 6 to 7 days/week to 0 to 1 days/week, per client report, for at least 3 consecutive months.  Progress: 30% Short Term Goals:  Pt to verbally express understanding of the relationship between feelings of depression and their impact on thinking patterns and behaviors.  Pt to verbalize an understanding of the role that distorted thinking plays in creating fears, excessive worry, and ruminations.  Progress: 30% Target Date:   12/18/2024 Frequency:  Bi-weekly Modality:  Cognitive Behavioral Therapy Interventions by Therapist:  Therapist will use CBT, Mindfulness exercises, Coping skills and Referrals, as needed by client. Client has verbally approved this treatment plan.  Francis KATHEE Macintosh, [facility]

## 2024-04-06 ENCOUNTER — Ambulatory Visit: Admitting: Psychology

## 2024-04-06 DIAGNOSIS — F4321 Adjustment disorder with depressed mood: Secondary | ICD-10-CM | POA: Diagnosis not present

## 2024-04-06 NOTE — Progress Notes (Signed)
 [facility] Counselor/Therapist Progress Note  Patient ID: Alexander Morrison, MRN: [id number],    Date: 04/06/2024  Time Spent: 58 mins  start time: 1300   end time: 1358  Treatment Type: Individual Therapy  Reported Symptoms: Pt presents for session via Caregility video.  He grants consent for the session, stating that he is in his home with no one else present; pt shares that he understands the limits of virtual sessions.  I shared with pt that I am in my office with no one else present.     Mental Status Exam: Appearance:  Casual     Behavior: Appropriate  Motor: Normal  Speech/Language:  Clear and Coherent  Affect: Appropriate  Mood: normal  Thought process: normal  Thought content:   WNL  Sensory/Perceptual disturbances:   WNL  Orientation: oriented to person, place, and time/date  Attention: Good  Concentration: Good  Memory: WNL  Fund of knowledge:  Good  Insight:   Good  Judgment:  Good  Impulse Control: Good   Risk Assessment: Danger to Self:  No Self-injurious Behavior: No Danger to Others: No Duty to Warn:no Physical Aggression / Violence:No  Access to Firearms a concern: No  Gang Involvement:No   Subjective: Pt shares, We hosted for Thanksgiving this year and I grilled some chicken and Morgan cooked a turkey breast for those who needed turkey.  We had our parents come and we had some friends join us  as well.  We had Thursday and Friday off; [place] [employer] and Junney got sick and she shared it with us  (GI bug).  Pt is feeling better already from his GI symptoms.  Pt shares that work is going OK; he is catching up from being out last week.  Pt shares that he and [name] are doing well and are working well together with Junney.  Pt has a performance the Saturday after our last session and he was received well and got lots of good feedback; it feels good when my ego gets stroked.  Pt shares his alcohol use has decreased since  the weekend with his stomach bug; he has been intentional about cutting back his alcohol use.  Pt shares that his dad and step mom are still in [place] and are doing fine.  He does not have the best relationship with her; she may or may not have been around before his parents split up (about 26 yo).  He met her about 2 yrs later.  Pt talks about how he was influenced by growing up with his dad and his dad's infidelity.  His dad was in the [employer] and was often deployed; his brother is 6 yrs older than pt and helped raise him because of his dad's absence and his mom not being all that.  Pt is happy that he can pass along to Junney the lessons that he has learned in part of his difficult life.  Pt shares that he has been sleeping better and he attributes that to less drinking; he has been trying to be more intentional about getting in bed by 10pm.  Pt continues to take his Buspar  daily.  Pt is planning to take a break from his pot use to give his lungs a break as well.  He has a performance this Saturday evening in [place]; it is a big deal for pt and he is looking forward to it.   Encouraged pt to continue with his self care activities and we  will meet in 3 wks for a follow up session.  Interventions: Cognitive Behavioral Therapy  Diagnosis:Adjustment disorder with depressed mood  Plan: Treatment Plan Strengths/Abilities:  Intelligent, Intuitive, Willing to participate in therapy Treatment Preferences:  Outpatient Individual Therapy Statement of Needs:  Patient is to use CBT, mindfulness and coping skills to help manage and/or decrease symptoms associated with their diagnosis. Symptoms:  Depressed/Irritable mood, worry, social withdrawal Problems Addressed:  Depressive thoughts, Sadness, Sleep issues, etc. Long Term Goals:  Pt to reduce overall level, frequency, and intensity of the feelings of depression as evidenced by decreased irritability, negative self talk, and helpless feelings from 6 to 7  days/week to 0 to 1 days/week, per client report, for at least 3 consecutive months.  Progress: 30% Short Term Goals:  Pt to verbally express understanding of the relationship between feelings of depression and their impact on thinking patterns and behaviors.  Pt to verbalize an understanding of the role that distorted thinking plays in creating fears, excessive worry, and ruminations.  Progress: 30% Target Date:  12/18/2024 Frequency:  Bi-weekly Modality:  Cognitive Behavioral Therapy Interventions by Therapist:  Therapist will use CBT, Mindfulness exercises, Coping skills and Referrals, as needed by client. Client has verbally approved this treatment plan.  Francis KATHEE Macintosh, [facility]

## 2024-04-21 ENCOUNTER — Ambulatory Visit: Admitting: Psychology

## 2024-04-21 DIAGNOSIS — F4321 Adjustment disorder with depressed mood: Secondary | ICD-10-CM

## 2024-04-21 NOTE — Progress Notes (Signed)
 [facility] Counselor/Therapist Progress Note  Patient ID: Alexander Morrison, MRN: [id number],    Date: 04/21/2024  Time Spent: 58 mins  start time: 1500   end time: 1558  Treatment Type: Individual Therapy  Reported Symptoms: Pt presents for session via Caregility video.  He grants consent for the session, stating that he is in his home with no one else present; pt shares that he understands the limits of virtual sessions.  I shared with pt that I am in my office with no one else present.     Mental Status Exam: Appearance:  Casual     Behavior: Appropriate  Motor: Normal  Speech/Language:  Clear and Coherent  Affect: Appropriate  Mood: normal  Thought process: normal  Thought content:   WNL  Sensory/Perceptual disturbances:   WNL  Orientation: oriented to person, place, and time/date  Attention: Good  Concentration: Good  Memory: WNL  Fund of knowledge:  Good  Insight:   Good  Judgment:  Good  Impulse Control: Good   Risk Assessment: Danger to Self:  No Self-injurious Behavior: No Danger to Others: No Duty to Warn:no Physical Aggression / Violence:No  Access to Firearms a concern: No  Gang Involvement:No   Subjective: Pt shares, We have been doing really well mentally, physically, emotionally around here.  Work is going well.  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  Hopefully, I can have that done by Friday.  Pt shares that they have been told by Alexander Morrison's daycare that they have another round of GI illnesses going around the facility.  Pt shares that he and Alexander Morrison and Alexander Morrison will go to his brother's on Christmas eve and will go to Alexander Morrison family on Christmas day.  Pt shares that he usually makes an Oreo dessert for Alexander Morrison eve and he is looking forward to that.  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   Pt shares his alcohol use has decreased and still feels good about that.  Pt shares he has talked with both of his parents recently about his greater understanding of how his own behavior as a child now that he has age and context for what he experienced and/or did.  Pt shares that he has been sleeping better and he attributes that to less drinking; he has been trying to be more intentional about getting in bed by 10pm.  Pt continues to take his Buspar  daily.  Encouraged pt to continue with his self care activities and we will meet in 3 wks for a follow up session.  Interventions: Cognitive Behavioral Therapy  Diagnosis:Adjustment disorder with depressed mood  Plan: Treatment Plan Strengths/Abilities:  Intelligent, Intuitive, Willing to participate in therapy Treatment Preferences:  Outpatient Individual Therapy Statement of Needs:  Patient is to use CBT, mindfulness and coping skills to help manage and/or decrease symptoms associated with their diagnosis. Symptoms:  Depressed/Irritable mood, worry, social withdrawal Problems Addressed:  Depressive thoughts, Sadness, Sleep issues, etc. Long Term Goals:  Pt to reduce overall level, frequency, and intensity of the feelings of depression as evidenced by decreased irritability, negative self talk, and helpless feelings from 6 to 7 days/week to 0 to 1 days/week, per client report, for at least 3 consecutive months.  Progress: 30% Short Term Goals:  Pt to verbally express understanding of the  relationship between feelings of depression and their impact on thinking patterns and behaviors.  Pt to verbalize an understanding of the role that distorted thinking plays in creating fears, excessive worry, and ruminations.  Progress: 30% Target Date:  12/18/2024 Frequency:  Bi-weekly Modality:  Cognitive Behavioral Therapy Interventions by Therapist:  Therapist will use CBT, Mindfulness exercises, Coping skills and Referrals, as needed by client. Client has  verbally approved this treatment plan.  Alexander Morrison, [facility]

## 2024-05-12 ENCOUNTER — Ambulatory Visit: Admitting: Psychology

## 2024-05-12 DIAGNOSIS — F4321 Adjustment disorder with depressed mood: Secondary | ICD-10-CM | POA: Diagnosis not present

## 2024-05-12 NOTE — Progress Notes (Signed)
 "   [facility] Counselor/Therapist Progress Note  Patient ID: Alexander Morrison, MRN: [id number],    Date: 05/12/2024  Time Spent: 45 mins  start time: 1400   end time: 1445  Treatment Type: Individual Therapy  Reported Symptoms: Pt presents for session via Caregility video.  He grants consent for the session, stating that he is in his home with no one else present; pt shares that he understands the limits of virtual sessions.  I shared with pt that I am in my office with no one else present.     Mental Status Exam: Appearance:  Casual     Behavior: Appropriate  Motor: Normal  Speech/Language:  Clear and Coherent  Affect: Appropriate  Mood: normal  Thought process: normal  Thought content:   WNL  Sensory/Perceptual disturbances:   WNL  Orientation: oriented to person, place, and time/date  Attention: Good  Concentration: Good  Memory: WNL  Fund of knowledge:  Good  Insight:   Good  Judgment:  Good  Impulse Control: Good   Risk Assessment: Danger to Self:  No Self-injurious Behavior: No Danger to Others: No Duty to Warn:no Physical Aggression / Violence:No  Access to Firearms a concern: No  Gang Involvement:No     Subjective: Pt shares, Christmas was good; we went to see Morgan's mom and Junnie got spoiled and we came back with more than we took.  We also did our gift exchange with my brother and his family as well.  Pt shares that his brother has had to take their kids out of daycare due to the expense; he is also working from home too so he is doing the daddy day care thing.  Pt shares that he and [name] have started the [place] Year with walks in the morning to be able to be more active and he is enjoying that.  Pt shares that he has been reminiscing about things (positive and negative) that he had encountered over the past 20+ years and has been reflecting on them in his current person.  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  Pt and [name] are being more intentional about spending time together and he enjoys that.  Pt shares, I failed dry January but I am being mindful about how much I drink regularly.  Pt shares that he also plans to stop smoking pot soon and is ambivalent about that.  Pt continues to take his Buspar  daily.  Encouraged pt to continue with his self care activities and we will meet in 3 wks for a follow up session.  Interventions: Cognitive Behavioral Therapy  Diagnosis:Adjustment disorder with depressed mood  Plan: Treatment Plan Strengths/Abilities:  Intelligent, Intuitive, Willing to participate in therapy Treatment Preferences:  Outpatient Individual Therapy Statement of Needs:  Patient is to use CBT, mindfulness and coping skills to help manage and/or decrease symptoms associated with their diagnosis. Symptoms:  Depressed/Irritable mood, worry, social withdrawal Problems Addressed:  Depressive thoughts, Sadness, Sleep issues, etc. Long Term Goals:  Pt to reduce overall level, frequency, and intensity of the feelings of depression as evidenced by decreased irritability, negative self talk, and helpless feelings from 6 to 7 days/week to 0 to 1 days/week, per client report, for at least 3 consecutive months.  Progress: 30% Short Term Goals:  Pt to verbally  express understanding of the relationship between feelings of depression and their impact on thinking patterns and behaviors.  Pt to verbalize an understanding of the role that distorted thinking plays in creating fears, excessive worry, and ruminations.  Progress: 30% Target Date:  12/18/2024 Frequency:  Bi-weekly Modality:  Cognitive Behavioral Therapy Interventions by Therapist:  Therapist will use CBT, Mindfulness exercises, Coping skills and Referrals, as needed by client. Client has verbally approved this treatment  plan.  Francis KATHEE Macintosh, [facility] "

## 2024-05-14 ENCOUNTER — Ambulatory Visit: Admitting: Physician Assistant

## 2024-05-17 ENCOUNTER — Encounter: Payer: Self-pay | Admitting: Physician Assistant

## 2024-05-17 ENCOUNTER — Ambulatory Visit: Admitting: Physician Assistant

## 2024-05-17 VITALS — BP 130/80 | HR 76 | Temp 97.9°F | Ht 71.0 in | Wt 300.5 lb

## 2024-05-17 DIAGNOSIS — Z1389 Encounter for screening for other disorder: Secondary | ICD-10-CM | POA: Diagnosis not present

## 2024-05-17 DIAGNOSIS — E669 Obesity, unspecified: Secondary | ICD-10-CM | POA: Diagnosis not present

## 2024-05-17 DIAGNOSIS — F419 Anxiety disorder, unspecified: Secondary | ICD-10-CM | POA: Diagnosis not present

## 2024-05-17 DIAGNOSIS — J4540 Moderate persistent asthma, uncomplicated: Secondary | ICD-10-CM

## 2024-05-17 DIAGNOSIS — Z889 Allergy status to unspecified drugs, medicaments and biological substances status: Secondary | ICD-10-CM | POA: Diagnosis not present

## 2024-05-17 DIAGNOSIS — I10 Essential (primary) hypertension: Secondary | ICD-10-CM

## 2024-05-17 DIAGNOSIS — R197 Diarrhea, unspecified: Secondary | ICD-10-CM | POA: Diagnosis not present

## 2024-05-17 DIAGNOSIS — F32A Depression, unspecified: Secondary | ICD-10-CM

## 2024-05-17 DIAGNOSIS — Z720 Tobacco use: Secondary | ICD-10-CM

## 2024-05-17 DIAGNOSIS — G4733 Obstructive sleep apnea (adult) (pediatric): Secondary | ICD-10-CM

## 2024-05-17 MED ORDER — ZEPBOUND 2.5 MG/0.5ML ~~LOC~~ SOAJ: 2.5000 mg | SUBCUTANEOUS | 0 refills | Status: AC

## 2024-05-17 MED ORDER — VARENICLINE TARTRATE (STARTER) 0.5 MG X 11 & 1 MG X 42 PO TBPK: ORAL_TABLET | ORAL | 0 refills | Status: AC

## 2024-05-17 NOTE — Progress Notes (Signed)
 "  History of Present Illness:   Chief Complaint  Patient presents with   Medical Management of Chronic Issues    Pt here for 6 month f/u, Hypertension, Insulin Resistance, Obesity, Anxiety and depression.    Discussed the use of AI scribe software for clinical note transcription with the patient, who gave verbal consent to proceed.  History of Present Illness   Alexander Morrison is a 39 year old male who presents with diarrhea and nausea.  He developed sudden onset watery diarrhea with urgency from Friday night into Saturday morning, with associated nausea but no vomiting. Symptoms have been frequent but slightly improving. He had reduced urination on the first day and is concerned about dehydration. He has increased fluid intake and can now sleep through the night without needing the bathroom. He has not used antidiarrheals and has only taken ginger and peppermint tea for symptom relief. He is trying to stay hydrated with water.  He takes valsartan  80 mg daily and home blood pressures are about 134/80 mmHg.   He takes buspirone  once daily at half the usual dose and plans to begin weaning off next Sunday because he feels he can manage his anxiety without it and has some discomfort when taking it in the morning.  He has asthma but no current respiratory symptoms. He is using QVAR  twice daily and has not required albuterol .   He vapes the equivalent of about half a pack of cigarettes per day and is interested in quitting.  He wants to improve his health by cutting back on smoking and alcohol and is interested in options for weight loss and craving control. He was told he is at the low end of the prediabetic range and has moderate sleep apnea, and he is concerned about weight gain if he quits smoking. He is working on adding in exercise and reduced portions.  He has a shellfish allergy with prior immediate reaction and breathing difficulty after shrimp in youth. He has strictly avoided shellfish  since but is unsure of the current severity and wants further evaluation. He does not take daily antihistamines.        Past Medical History:  Diagnosis Date   Allergy    Hypertension      Social History[1]  Past Surgical History:  Procedure Laterality Date   APPENDECTOMY     HIP PINNING Bilateral 2001    Family History  Problem Relation Age of Onset   Hypertension Mother    Hypertension Father        no longer on meds   Hypertension Brother    Lymphoma Brother    Alzheimer's disease Maternal Grandmother    Colon cancer Maternal Grandfather 64       late 60's   Hypertension Paternal Grandmother    Heart disease Paternal Grandmother    Diabetes Paternal Grandmother    Diabetes Other    CAD Other    Cancer Other    Stroke Other    Prostate cancer Neg Hx     Allergies[2]  Current Medications:  Current Medications[3]   Review of Systems:   Negative unless otherwise specified per HPI.  Vitals:   Vitals:   05/17/24 0850  BP: 130/80  Pulse: 76  Temp: 97.9 F (36.6 C)  TempSrc: Temporal  SpO2: 95%  Weight: (!) 300 lb 8 oz (136.3 kg)  Height: 5' 11 (1.803 m)     Body mass index is 41.91 kg/m.  Physical Exam:   Physical Exam  Vitals and nursing note reviewed.  Constitutional:      General: He is not in acute distress.    Appearance: He is well-developed. He is not ill-appearing or toxic-appearing.  Cardiovascular:     Rate and Rhythm: Normal rate and regular rhythm.     Pulses: Normal pulses.     Heart sounds: Normal heart sounds, S1 normal and S2 normal.  Pulmonary:     Effort: Pulmonary effort is normal.     Breath sounds: Normal breath sounds.  Skin:    General: Skin is warm and dry.  Neurological:     Mental Status: He is alert.     GCS: GCS eye subscore is 4. GCS verbal subscore is 5. GCS motor subscore is 6.  Psychiatric:        Speech: Speech normal.        Behavior: Behavior normal. Behavior is cooperative.     Assessment and  Plan:   Assessment and Plan    Acute diarrhea Likely foodborne illness with dehydration risk due to fluid loss. - Maintain hydration with increased fluid intake. - Follow BRAT diet. - Practice good hand hygiene. - Consider stool sample testing if symptoms persist beyond the week.  Primary hypertension Blood pressure well-controlled with valsartan . Dehydration may affect readings. - Continue valsartan  as prescribed. - Monitor blood pressure at home regularly.  Anxiety and depression  Plans to wean off Buspar . Managing anxiety manually. - Continue current Buspar  regimen and wean off as planned. - Continue close follow up with psychology   Moderate persistent asthma without complication  Asthma well-controlled. - Continue current asthma management plan.  Tobacco use  Considering cessation with Chantix . Discussed potential side effects. - Prescribed Chantix  with instructions to choose a quit date and start 3-4 days prior. - Continue to use nicotine gum as needed during cessation process.  Obesity; Obstructive sleep apnea, moderate Considering Zepbound  for weight loss. Discussed benefits and concerns. - Submitted prior authorization for Zepbound  under the indication of obstructive sleep apnea. - Encouraged lifestyle modifications including increased physical activity and dietary changes.  Multiple allergies  Reported childhood anaphylactic reaction. Desires confirmation of allergy status. - Referred to allergy specialist for skin prick testing to confirm shellfish allergy.         [name], PA-C    [1]  Social History Tobacco Use   Smoking status: Former    Current packs/day: 0.00    Average packs/day: 0.5 packs/day    Types: Cigarettes    Quit date: 09/01/2012    Years since quitting: 11.7    Passive exposure: Current   Smokeless tobacco: Never  Vaping Use   Vaping status: Every Day   Substances: Nicotine  Substance Use Topics   Alcohol use: Not  Currently    Comment: 3 drinks per day x approx 3-4x/wk   Drug use: Yes    Types: Marijuana  [2]  Allergies Allergen Reactions   Shellfish Allergy Anaphylaxis   Iodine Swelling  [3]  Current Outpatient Medications:    acetaminophen (TYLENOL) 325 MG tablet, Take 650 mg by mouth every 6 (six) hours as needed., Disp: , Rfl:    albuterol  (VENTOLIN  HFA) 108 (90 Base) MCG/ACT inhaler, Inhale 1-2 puffs into the lungs every 4 (four) hours as needed for wheezing or shortness of breath., Disp: 8.5 each, Rfl: 3   beclomethasone (QVAR  REDIHALER) 40 MCG/ACT inhaler, Inhale 2 puffs into the lungs 2 (two) times daily., Disp: 1 each, Rfl: 6   busPIRone  (BUSPAR ) 15 MG  tablet, TAKE 1 TABLET (15 MG TOTAL) BY MOUTH 3 (THREE) TIMES DAILY AS NEEDED (ANXIETY)., Disp: 30 tablet, Rfl: 2   calcium carbonate (TUMS - DOSED IN MG ELEMENTAL CALCIUM) 500 MG chewable tablet, Chew 1 tablet by mouth as needed for indigestion or heartburn., Disp: , Rfl:    esomeprazole  (NEXIUM ) 40 MG capsule, Take 1 capsule (40 mg total) by mouth daily., Disp: 90 capsule, Rfl: 1   fluticasone  (FLONASE ) 50 MCG/ACT nasal spray, SPRAY 1 SPRAY INTO BOTH NOSTRILS DAILY., Disp: 16 mL, Rfl: 1   loratadine (CLARITIN) 10 MG tablet, Take 10 mg by mouth daily., Disp: , Rfl:    tirzepatide  (ZEPBOUND ) 2.5 MG/0.5ML Pen, Inject 2.5 mg into the skin once a week., Disp: 2 mL, Rfl: 0   valsartan  (DIOVAN ) 80 MG tablet, Take 1 tablet (80 mg total) by mouth daily., Disp: 90 tablet, Rfl: 3   Varenicline  Tartrate, Starter, (CHANTIX  STARTING MONTH PAK) 0.5 MG X 11 & 1 MG X 42 TBPK, Take pack as directed, Disp: 53 each, Rfl: 0  "

## 2024-05-17 NOTE — Patient Instructions (Signed)
 SABRA

## 2024-05-19 ENCOUNTER — Ambulatory Visit: Payer: Self-pay | Admitting: Physician Assistant

## 2024-05-19 ENCOUNTER — Other Ambulatory Visit

## 2024-05-19 ENCOUNTER — Other Ambulatory Visit: Payer: Self-pay | Admitting: Physician Assistant

## 2024-05-19 ENCOUNTER — Encounter: Payer: Self-pay | Admitting: Physician Assistant

## 2024-05-19 DIAGNOSIS — R197 Diarrhea, unspecified: Secondary | ICD-10-CM

## 2024-05-19 LAB — CBC WITH DIFFERENTIAL/PLATELET
Basophils Absolute: 0 K/uL (ref 0.0–0.1)
Basophils Relative: 0.8 % (ref 0.0–3.0)
Eosinophils Absolute: 0 K/uL (ref 0.0–0.7)
Eosinophils Relative: 1 % (ref 0.0–5.0)
HCT: 44.4 % (ref 39.0–52.0)
Hemoglobin: 14.8 g/dL (ref 13.0–17.0)
Lymphocytes Relative: 35 % (ref 12.0–46.0)
Lymphs Abs: 1.4 K/uL (ref 0.7–4.0)
MCHC: 33.2 g/dL (ref 30.0–36.0)
MCV: 85.8 fl (ref 78.0–100.0)
Monocytes Absolute: 0.6 K/uL (ref 0.1–1.0)
Monocytes Relative: 13.7 % — ABNORMAL HIGH (ref 3.0–12.0)
Neutro Abs: 2 K/uL (ref 1.4–7.7)
Neutrophils Relative %: 49.5 % (ref 43.0–77.0)
Platelets: 291 K/uL (ref 150.0–400.0)
RBC: 5.18 Mil/uL (ref 4.22–5.81)
RDW: 13.4 % (ref 11.5–15.5)
WBC: 4 K/uL (ref 4.0–10.5)

## 2024-05-19 LAB — COMPREHENSIVE METABOLIC PANEL WITH GFR
ALT: 41 U/L (ref 3–53)
AST: 24 U/L (ref 5–37)
Albumin: 4.3 g/dL (ref 3.5–5.2)
Alkaline Phosphatase: 77 U/L (ref 39–117)
BUN: 10 mg/dL (ref 6–23)
CO2: 29 meq/L (ref 19–32)
Calcium: 9.2 mg/dL (ref 8.4–10.5)
Chloride: 102 meq/L (ref 96–112)
Creatinine, Ser: 1.12 mg/dL (ref 0.40–1.50)
GFR: 84.47 mL/min
Glucose, Bld: 87 mg/dL (ref 70–99)
Potassium: 3.8 meq/L (ref 3.5–5.1)
Sodium: 137 meq/L (ref 135–145)
Total Bilirubin: 0.4 mg/dL (ref 0.2–1.2)
Total Protein: 7.4 g/dL (ref 6.0–8.3)

## 2024-05-19 LAB — LIPASE: Lipase: 18 U/L (ref 11.0–59.0)

## 2024-05-21 LAB — GI PROFILE, STOOL, PCR

## 2024-05-22 ENCOUNTER — Encounter

## 2024-05-24 ENCOUNTER — Other Ambulatory Visit (HOSPITAL_COMMUNITY): Payer: Self-pay

## 2024-05-24 ENCOUNTER — Telehealth: Payer: Self-pay

## 2024-05-24 NOTE — Telephone Encounter (Signed)
 Pharmacy Patient Advocate Encounter   Received notification from CoverMyMeds that prior authorization for Zepbound  2.5mg /0.52ml is required/requested.   Insurance verification completed.   The patient is insured through [employer].   Per test claim: PA required; PA submitted to above mentioned insurance via Latent Key/confirmation #/EOC [id number] Status is pending

## 2024-05-25 ENCOUNTER — Other Ambulatory Visit (HOSPITAL_COMMUNITY): Payer: Self-pay

## 2024-05-25 NOTE — Telephone Encounter (Signed)
 Pharmacy Patient Advocate Encounter  Received notification from [employer] that Prior Authorization for Zepbound  2.5mg /0.10ml has been APPROVED from 05/24/24 to 11/20/24   PA #/Case ID/Reference #: [id number]

## 2024-05-25 NOTE — Telephone Encounter (Signed)
 Noted.

## 2024-05-28 ENCOUNTER — Ambulatory Visit: Admitting: Psychology

## 2024-05-28 DIAGNOSIS — F4321 Adjustment disorder with depressed mood: Secondary | ICD-10-CM

## 2024-05-28 NOTE — Progress Notes (Signed)
 "   Behavioral Health Counselor/Therapist Progress Note  Patient ID: Alexander Morrison, MRN: [id number],    Date: 05/28/2024  Time Spent: 55 mins  start time: 1300   end time: 1355  Treatment Type: Individual Therapy  Reported Symptoms: Pt presents for session via Caregility video.  He grants consent for the session, stating that he is in his home with no one else present; pt shares that he understands the limits of virtual sessions.  I shared with pt that I am in my office with no one else present.     Mental Status Exam: Appearance:  Casual     Behavior: Appropriate  Motor: Normal  Speech/Language:  Clear and Coherent  Affect: Appropriate  Mood: normal  Thought process: normal  Thought content:   WNL  Sensory/Perceptual disturbances:   WNL  Orientation: oriented to person, place, and time/date  Attention: Good  Concentration: Good  Memory: WNL  Fund of knowledge:  Good  Insight:   Good  Judgment:  Good  Impulse Control: Good   Risk Assessment: Danger to Self:  No Self-injurious Behavior: No Danger to Others: No Duty to Warn:no Physical Aggression / Violence:No  Access to Firearms a concern: No  Gang Involvement:No   Notified of retirement; will consider what he would like to do  Subjective: Pt shares, I have been up and down but mostly good.  The biggest thing going on with me right now is my unhappiness with my job.  I have been applying for other jobs every time they piss me off at work.  [name] is talking more and that is fun.  I also had a reaction with my anti-acid and had GI issues for a week.  Once I got an understanding of what was going on, I am now better.  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  Talked with client about my retirement; he will consider his options and we will talk more about it in our next session.  He is applying for jobs that are in his same field; he is  also considering the option of going back to school for either some sort of certification or to explore other opportunities.  Pt talked with his PCP about his desire to stop smoking; she prescribed him Chantix  and he is trying to decide when he wants to start the process.  He also has a prescription for a GLP-1 and he is thinking about when to start that as well.  Pt shares that he and [name] have not been able to walk much since our last session, due to the weather.  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  Pt and [name] are being more intentional about spending time together and he enjoys that.  Pt shares, I failed dry January but I am being mindful about how much I drink regularly.  Pt continues to take his Buspar  daily.  Encouraged pt to continue with his self care activities and we will meet in 3 wks for a follow up session.  Interventions: Cognitive Behavioral Therapy  Diagnosis:Adjustment disorder with depressed mood  Plan: Treatment Plan Strengths/Abilities:  Intelligent, Intuitive, Willing to participate in therapy Treatment Preferences:  Outpatient Individual Therapy Statement of Needs:  Patient is to use CBT, mindfulness and coping skills to help manage and/or decrease symptoms associated with their  diagnosis. Symptoms:  Depressed/Irritable mood, worry, social withdrawal Problems Addressed:  Depressive thoughts, Sadness, Sleep issues, etc. Long Term Goals:  Pt to reduce overall level, frequency, and intensity of the feelings of depression as evidenced by decreased irritability, negative self talk, and helpless feelings from 6 to 7 days/week to 0 to 1 days/week, per client report, for at least 3 consecutive months.  Progress: 30% Short Term Goals:  Pt to verbally express understanding of the relationship between feelings of depression and their impact on thinking patterns and behaviors.  Pt to verbalize an understanding of the role that  distorted thinking plays in creating fears, excessive worry, and ruminations.  Progress: 30% Target Date:  12/18/2024 Frequency:  Bi-weekly Modality:  Cognitive Behavioral Therapy Interventions by Therapist:  Therapist will use CBT, Mindfulness exercises, Coping skills and Referrals, as needed by client. Client has verbally approved this treatment plan.  Francis KATHEE Macintosh, [facility] "

## 2024-06-08 ENCOUNTER — Other Ambulatory Visit: Payer: Self-pay | Admitting: Physician Assistant

## 2024-06-09 ENCOUNTER — Telehealth: Admitting: Physician Assistant

## 2024-06-09 DIAGNOSIS — J101 Influenza due to other identified influenza virus with other respiratory manifestations: Secondary | ICD-10-CM

## 2024-06-09 DIAGNOSIS — J4540 Moderate persistent asthma, uncomplicated: Secondary | ICD-10-CM

## 2024-06-09 MED ORDER — ALBUTEROL SULFATE HFA 108 (90 BASE) MCG/ACT IN AERS: 1.0000 | INHALATION_SPRAY | RESPIRATORY_TRACT | 3 refills | Status: AC | PRN

## 2024-06-09 MED ORDER — OSELTAMIVIR PHOSPHATE 75 MG PO CAPS: 75.0000 mg | ORAL_CAPSULE | Freq: Two times a day (BID) | ORAL | 0 refills | Status: AC

## 2024-06-09 MED ORDER — PREDNISONE 20 MG PO TABS: 20.0000 mg | ORAL_TABLET | Freq: Two times a day (BID) | ORAL | 0 refills | Status: AC

## 2024-06-09 NOTE — Progress Notes (Signed)
 "   Virtual Visit via Video Note   I, Alexander Morrison, connected with  Alexander Morrison  ([id number], October 24, 1985) on 06/09/24 at  1:40 PM EST by a video-enabled telemedicine application and verified that I am speaking with the correct person using two identifiers.  Location: Patient: Home Provider: [place] office   I discussed the limitations of evaluation and management by telemedicine and the availability of in person appointments. The patient expressed understanding and agreed to proceed.    Discussed the use of AI scribe software for clinical note transcription with the patient, who gave verbal consent to proceed.  History of Present Illness   Alexander Morrison is a 39 year old male who presents with flu symptoms and a positive test for influenza A.  Symptoms started about three to four days ago. He tested positive for influenza A last night and has not had a flu shot this season. His daughter has been ill for about two weeks with suspected flu.  He has no current breathing difficulties and feels his breathing is stable. He needs a refill of his albuterol  inhaler and cannot afford Qvar  until Friday. He has not started Chantix  yet and plans to begin it when he is not sick.       Problems:  Patient Active Problem List   Diagnosis Date Noted   Left leg pain 02/17/2023   Paresthesia 02/17/2023   Snoring 10/23/2016   Moderate persistent asthma without complication 10/21/2016   Depression, recurrent 10/21/2016   Obesity 10/21/2016   Allergy 10/21/2016    Allergies: Allergies[1] Medications: Current Medications[2]  Observations/Objective: Patient is well-developed, well-nourished in no acute distress.  Resting comfortably  at home.  Head is normocephalic, atraumatic.  No labored breathing.  Speech is clear and coherent with logical content.  Patient is alert and oriented at baseline.   Assessment and Plan    Influenza A Acute Influenza A confirmed. Tamiflu   prescribed to reduce symptom duration and severity. Discussed Tamiflu  side effects and advised discontinuation if symptoms worsen. - Prescribed Tamiflu . - Provided work note.  Moderate persistent asthma without complication  Albuterol  refill needed. Discussed Chantix  for smoking cessation. - Prescribed albuterol . - Prescribed prednisone  for emergency use. - Discussed Chantix  for smoking cessation.       Follow Up Instructions: I discussed the assessment and treatment plan with the patient. The patient was provided an opportunity to ask questions and all were answered. The patient agreed with the plan and demonstrated an understanding of the instructions.  A copy of instructions were sent to the patient via MyChart unless otherwise noted below.   The patient was advised to call back or seek an in-person evaluation if the symptoms worsen or if the condition fails to improve as anticipated.  Alexander Buttner, PA    [1]  Allergies Allergen Reactions   Shellfish Allergy Anaphylaxis   Iodine Swelling  [2]  Current Outpatient Medications:    acetaminophen (TYLENOL) 325 MG tablet, Take 650 mg by mouth every 6 (six) hours as needed., Disp: , Rfl:    albuterol  (VENTOLIN  HFA) 108 (90 Base) MCG/ACT inhaler, Inhale 1-2 puffs into the lungs every 4 (four) hours as needed for wheezing or shortness of breath., Disp: 8.5 each, Rfl: 3   beclomethasone (QVAR  REDIHALER) 40 MCG/ACT inhaler, Inhale 2 puffs into the lungs 2 (two) times daily., Disp: 1 each, Rfl: 6   busPIRone  (BUSPAR ) 15 MG tablet, TAKE 1 TABLET (15 MG TOTAL) BY MOUTH 3 (THREE) TIMES  DAILY AS NEEDED (ANXIETY)., Disp: 30 tablet, Rfl: 2   calcium carbonate (TUMS - DOSED IN MG ELEMENTAL CALCIUM) 500 MG chewable tablet, Chew 1 tablet by mouth as needed for indigestion or heartburn., Disp: , Rfl:    esomeprazole  (NEXIUM ) 40 MG capsule, Take 1 capsule (40 mg total) by mouth daily., Disp: 90 capsule, Rfl: 1   fluticasone  (FLONASE ) 50 MCG/ACT nasal  spray, SPRAY 1 SPRAY INTO BOTH NOSTRILS DAILY., Disp: 16 mL, Rfl: 1   loratadine (CLARITIN) 10 MG tablet, Take 10 mg by mouth daily., Disp: , Rfl:    oseltamivir  (TAMIFLU ) 75 MG capsule, Take 1 capsule (75 mg total) by mouth 2 (two) times daily., Disp: 10 capsule, Rfl: 0   predniSONE  (DELTASONE ) 20 MG tablet, Take 1 tablet (20 mg total) by mouth 2 (two) times daily with a meal., Disp: 10 tablet, Rfl: 0   tirzepatide  (ZEPBOUND ) 2.5 MG/0.5ML Pen, Inject 2.5 mg into the skin once a week., Disp: 2 mL, Rfl: 0   valsartan  (DIOVAN ) 80 MG tablet, Take 1 tablet (80 mg total) by mouth daily., Disp: 90 tablet, Rfl: 3   Varenicline  Tartrate, Starter, (CHANTIX  STARTING MONTH PAK) 0.5 MG X 11 & 1 MG X 42 TBPK, Take pack as directed, Disp: 53 each, Rfl: 0  "

## 2024-06-11 ENCOUNTER — Ambulatory Visit: Admitting: Psychology

## 2024-06-11 DIAGNOSIS — F4321 Adjustment disorder with depressed mood: Secondary | ICD-10-CM

## 2024-06-11 NOTE — Progress Notes (Signed)
 "  E[facility] Behavioral Health Counselor/Therapist Progress Note  Patient ID: Alexander Morrison, MRN: [id number],    Date: 06/11/2024  Time Spent: 58 mins  start time: 1300   end time: 1358  Treatment Type: Individual Therapy  Reported Symptoms: Pt presents for session via Caregility video.  He grants consent for the session, stating that he is in his home with no one else present; pt shares that he understands the limits of virtual sessions.  I shared with pt that I am in my office with no one else present.     Mental Status Exam: Appearance:  Casual     Behavior: Appropriate  Motor: Normal  Speech/Language:  Clear and Coherent  Affect: Appropriate  Mood: normal  Thought process: normal  Thought content:   WNL  Sensory/Perceptual disturbances:   WNL  Orientation: oriented to person, place, and time/date  Attention: Good  Concentration: Good  Memory: WNL  Fund of knowledge:  Good  Insight:   Good  Judgment:  Good  Impulse Control: Good   Risk Assessment: Danger to Self:  No Self-injurious Behavior: No Danger to Others: No Duty to Warn:no Physical Aggression / Violence:No  Access to Firearms a concern: No  Gang Involvement:No   Notified of retirement; will take a break after we finish in March  Subjective: Pt shares, I have been OK since our last session.  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  I am also going to go to our [place] office to be in person next week (Thursday) and I am not so excited about it.  Pt shares that he does not like several of the people that he has to interact with online for work and he is hopeful that seeing some of these folks in person will help with their relationship.  Pt is planning to to listen to an Audio book on his trip to and from [place].  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  He shares that Juney is doing better and is healthier and is back at school.  [name] has been sick since Christmas off and on as well.  Pt shares that he has Zepbound  and Chantix  in his refrigerator right now.  He is hopeful that he will benefit for both of those medications.  Pt is also planning to exercise more as well.  Pt brings up the racist post from Trump recently and is not surprised by it but sees it as expected performance from Trump.  Pt shares he has decided that he will take a break from therapy when we finish in March.  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  Pt and [name] are being more intentional about spending time together and he enjoys that.  Pt continues to take his Buspar  daily.  Encouraged pt to continue with his self care activities and we will meet in 2 wks for a follow up session.  Interventions: Cognitive Behavioral Therapy  Diagnosis:Adjustment disorder with depressed mood  Plan: Treatment Plan Strengths/Abilities:  Intelligent, Intuitive, Willing to participate in therapy Treatment Preferences:  Outpatient Individual Therapy Statement of Needs:  Patient is to use CBT,  mindfulness and coping skills to help manage and/or decrease symptoms associated with their diagnosis. Symptoms:  Depressed/Irritable mood, worry, social withdrawal Problems Addressed:  Depressive thoughts, Sadness, Sleep issues, etc. Long Term Goals:  Pt to reduce overall level, frequency, and intensity of the feelings of depression as evidenced by decreased irritability, negative self talk, and helpless feelings from 6 to 7 days/week to 0 to 1 days/week, per client report, for at least 3 consecutive months.  Progress: 30% Short Term Goals:  Pt to verbally express understanding of the relationship between feelings of depression and their impact  on thinking patterns and behaviors.  Pt to verbalize an understanding of the role that distorted thinking plays in creating fears, excessive worry, and ruminations.  Progress: 30% Target Date:  12/18/2024 Frequency:  Bi-weekly Modality:  Cognitive Behavioral Therapy Interventions by Therapist:  Therapist will use CBT, Mindfulness exercises, Coping skills and Referrals, as needed by client. Client has verbally approved this treatment plan.  Francis KATHEE Macintosh, [facility] "

## 2024-11-22 ENCOUNTER — Encounter: Admitting: Physician Assistant
